# Patient Record
Sex: Female | Born: 1935
Health system: Southern US, Community
[De-identification: ages and names within clinical notes are randomized; demographics above are authoritative.]

## PROBLEM LIST (undated history)

## (undated) DIAGNOSIS — M5136 Other intervertebral disc degeneration, lumbar region: Secondary | ICD-10-CM

## (undated) DIAGNOSIS — C50919 Malignant neoplasm of unspecified site of unspecified female breast: Secondary | ICD-10-CM

## (undated) DIAGNOSIS — N179 Acute kidney failure, unspecified: Secondary | ICD-10-CM

## (undated) DIAGNOSIS — F0390 Unspecified dementia without behavioral disturbance: Secondary | ICD-10-CM

## (undated) DIAGNOSIS — H3530 Unspecified macular degeneration: Secondary | ICD-10-CM

## (undated) DIAGNOSIS — S065XAA Traumatic subdural hemorrhage with loss of consciousness status unknown, initial encounter: Secondary | ICD-10-CM

## (undated) DIAGNOSIS — S32000A Wedge compression fracture of unspecified lumbar vertebra, initial encounter for closed fracture: Secondary | ICD-10-CM

## (undated) DIAGNOSIS — S065X9A Traumatic subdural hemorrhage with loss of consciousness of unspecified duration, initial encounter: Secondary | ICD-10-CM

## (undated) DIAGNOSIS — S37009A Unspecified injury of unspecified kidney, initial encounter: Secondary | ICD-10-CM

## (undated) DIAGNOSIS — E871 Hypo-osmolality and hyponatremia: Secondary | ICD-10-CM

## (undated) DIAGNOSIS — R918 Other nonspecific abnormal finding of lung field: Secondary | ICD-10-CM

## (undated) DIAGNOSIS — F411 Generalized anxiety disorder: Secondary | ICD-10-CM

## (undated) DIAGNOSIS — A419 Sepsis, unspecified organism: Secondary | ICD-10-CM

## (undated) DIAGNOSIS — M51369 Other intervertebral disc degeneration, lumbar region without mention of lumbar back pain or lower extremity pain: Secondary | ICD-10-CM

## (undated) DIAGNOSIS — I872 Venous insufficiency (chronic) (peripheral): Secondary | ICD-10-CM

## (undated) DIAGNOSIS — K219 Gastro-esophageal reflux disease without esophagitis: Secondary | ICD-10-CM

## (undated) DIAGNOSIS — I10 Essential (primary) hypertension: Secondary | ICD-10-CM

---

## 2000-08-03 ENCOUNTER — Ambulatory Visit (HOSPITAL_COMMUNITY): Admission: RE | Admit: 2000-08-03 | Discharge: 2000-08-03 | Payer: Self-pay | Admitting: *Deleted

## 2000-08-03 ENCOUNTER — Encounter: Payer: Self-pay | Admitting: *Deleted

## 2011-11-03 ENCOUNTER — Ambulatory Visit (HOSPITAL_COMMUNITY)
Admission: RE | Admit: 2011-11-03 | Discharge: 2011-11-03 | Disposition: A | Discharge status: Home / Self Care | Payer: Medicare Other | Source: Ambulatory Visit | Attending: Family Medicine | Admitting: Family Medicine

## 2011-11-03 DIAGNOSIS — IMO0001 Reserved for inherently not codable concepts without codable children: Secondary | ICD-10-CM | POA: Insufficient documentation

## 2011-11-03 DIAGNOSIS — M79609 Pain in unspecified limb: Secondary | ICD-10-CM | POA: Insufficient documentation

## 2011-11-03 DIAGNOSIS — M7989 Other specified soft tissue disorders: Secondary | ICD-10-CM | POA: Insufficient documentation

## 2011-11-03 NOTE — Evaluation (Signed)
Physical Therapy Evaluation  Patient Details  Name: [name] MRN: [id number] Date of Birth: 10-29-1922  Today's Date: 11/03/2011 Time: 1530-1600 PT Time Calculation (min): 30 min  Visit#: 1  of 1   Assessment Diagnosis: leg pain  Authorization: medicare  Authorization Time Period:    Authorization Visit#: 1  of 1    Past Medical History: No past medical history on file. Past Surgical History: No past surgical history on file.  Subjective Symptoms/Limitations Symptoms: [name] is a resident in a nursing home.  She states that her legs have been hurting her for a very long time.  She comes to the department with Unnaboots on both LE wrapped in coban .  She has been referred for wound care. How long can you sit comfortably?: The patient is wheelchair bound.  She needs mod-max assist for transsfers How long can you stand comfortably?: N/A How long can you walk comfortably?: N/A Pain Assessment Currently in Pain?: Yes Pain Score:   6 Pain Location: Leg Pain Orientation: Right;Left;Lower Pain Type: Chronic pain Pain Onset: More than a month ago Pain Frequency: Constant Pain Relieving Factors: elevation; compression    Prior Functioning  Home Living Lives With:  (Pt lives in a nursing facility.)   Assessment  Pt was referred for wound care.  There were no wounds on either the right or the left LE.  Pt would benefit from compression hose.    Physical Therapy Assessment and Plan PT Assessment and Plan Clinical Impression Statement: Pt no longer has a wound.  The patient does have increased swelling and complains of her legs hurting.  Therapist placed TED hose on pt.  Pt may benefit from low compression hose,(15-20).  Therapist would recommend higher compression but placing TED hose onto patient was painful.   PT Plan: discharge pt no skilled therapy is needed    Goals  none   General Behavior During Session: [facility] for tasks performed Cognition: [facility] for tasks  performed  GP Functional Assessment Tool Used: observation of wound.  Wound is currently healed Functional Limitation: Other PT primary Other PT Primary Current Status (Y7829): 0 percent impaired, limited or restricted Other PT Primary Goal Status (F6213): 0 percent impaired, limited or restricted Other PT Primary Discharge Status [phone]): 0 percent impaired, limited or restricted  Hosam Mcfetridge,CINDY 11/03/2011, 4:24 PM  Physician Documentation Your signature is required to indicate approval of the treatment plan as stated above.  Please sign and either send electronically or make a copy of this report for your files and return this physician signed original.   Please mark one 1.__approve of plan  2. ___approve of plan with the following conditions.   ______________________________                                                          _____________________ Physician Signature  Date  

## 2012-01-18 ENCOUNTER — Telehealth (HOSPITAL_COMMUNITY): Payer: Self-pay | Admitting: Physical Therapy

## 2012-06-05 ENCOUNTER — Other Ambulatory Visit (HOSPITAL_COMMUNITY): Payer: Self-pay | Admitting: Internal Medicine

## 2012-06-05 DIAGNOSIS — J189 Pneumonia, unspecified organism: Secondary | ICD-10-CM

## 2012-06-06 ENCOUNTER — Ambulatory Visit (HOSPITAL_COMMUNITY)
Admission: RE | Admit: 2012-06-06 | Discharge: 2012-06-06 | Disposition: A | Discharge status: Home / Self Care | Payer: PRIVATE HEALTH INSURANCE | Source: Ambulatory Visit | Attending: Internal Medicine | Admitting: Internal Medicine

## 2012-06-06 DIAGNOSIS — K8020 Calculus of gallbladder without cholecystitis without obstruction: Secondary | ICD-10-CM | POA: Insufficient documentation

## 2012-06-06 DIAGNOSIS — J189 Pneumonia, unspecified organism: Secondary | ICD-10-CM | POA: Insufficient documentation

## 2012-06-06 DIAGNOSIS — R222 Localized swelling, mass and lump, trunk: Secondary | ICD-10-CM | POA: Insufficient documentation

## 2012-06-16 ENCOUNTER — Other Ambulatory Visit (HOSPITAL_COMMUNITY): Payer: Self-pay | Admitting: Internal Medicine

## 2012-06-16 DIAGNOSIS — R918 Other nonspecific abnormal finding of lung field: Secondary | ICD-10-CM

## 2012-06-20 ENCOUNTER — Ambulatory Visit (HOSPITAL_COMMUNITY)
Admission: RE | Admit: 2012-06-20 | Discharge: 2012-06-20 | Disposition: A | Discharge status: Home / Self Care | Payer: PRIVATE HEALTH INSURANCE | Source: Ambulatory Visit | Attending: Internal Medicine | Admitting: Internal Medicine

## 2012-06-20 ENCOUNTER — Encounter (HOSPITAL_COMMUNITY): Payer: Self-pay

## 2012-06-20 DIAGNOSIS — K5730 Diverticulosis of large intestine without perforation or abscess without bleeding: Secondary | ICD-10-CM | POA: Insufficient documentation

## 2012-06-20 DIAGNOSIS — I700 Atherosclerosis of aorta: Secondary | ICD-10-CM | POA: Insufficient documentation

## 2012-06-20 DIAGNOSIS — K439 Ventral hernia without obstruction or gangrene: Secondary | ICD-10-CM | POA: Insufficient documentation

## 2012-06-20 DIAGNOSIS — K449 Diaphragmatic hernia without obstruction or gangrene: Secondary | ICD-10-CM | POA: Insufficient documentation

## 2012-06-20 DIAGNOSIS — Z9010 Acquired absence of unspecified breast and nipple: Secondary | ICD-10-CM | POA: Insufficient documentation

## 2012-06-20 DIAGNOSIS — R911 Solitary pulmonary nodule: Secondary | ICD-10-CM | POA: Insufficient documentation

## 2012-06-20 DIAGNOSIS — I10 Essential (primary) hypertension: Secondary | ICD-10-CM | POA: Insufficient documentation

## 2012-06-20 DIAGNOSIS — I2510 Atherosclerotic heart disease of native coronary artery without angina pectoris: Secondary | ICD-10-CM | POA: Insufficient documentation

## 2012-06-20 DIAGNOSIS — K8020 Calculus of gallbladder without cholecystitis without obstruction: Secondary | ICD-10-CM | POA: Insufficient documentation

## 2012-06-20 DIAGNOSIS — R918 Other nonspecific abnormal finding of lung field: Secondary | ICD-10-CM

## 2012-06-20 DIAGNOSIS — F0390 Unspecified dementia without behavioral disturbance: Secondary | ICD-10-CM | POA: Insufficient documentation

## 2012-06-20 DIAGNOSIS — Z90710 Acquired absence of both cervix and uterus: Secondary | ICD-10-CM | POA: Insufficient documentation

## 2012-06-20 HISTORY — DX: Generalized anxiety disorder: F41.1

## 2012-06-20 HISTORY — DX: Unspecified dementia, unspecified severity, without behavioral disturbance, psychotic disturbance, mood disturbance, and anxiety: F03.90

## 2012-06-20 HISTORY — DX: Malignant neoplasm of unspecified site of unspecified female breast: C50.919

## 2012-06-20 HISTORY — DX: Sepsis, unspecified organism: A41.9

## 2012-06-20 HISTORY — DX: Other nonspecific abnormal finding of lung field: R91.8

## 2012-06-20 HISTORY — DX: Unspecified injury of unspecified kidney, initial encounter: S37.009A

## 2012-06-20 HISTORY — DX: Venous insufficiency (chronic) (peripheral): I87.2

## 2012-06-20 HISTORY — DX: Gastro-esophageal reflux disease without esophagitis: K21.9

## 2012-06-20 HISTORY — DX: Acute kidney failure, unspecified: N17.9

## 2012-06-20 HISTORY — DX: Traumatic subdural hemorrhage with loss of consciousness of unspecified duration, initial encounter: S06.5X9A

## 2012-06-20 HISTORY — DX: Essential (primary) hypertension: I10

## 2012-06-20 HISTORY — DX: Traumatic subdural hemorrhage with loss of consciousness status unknown, initial encounter: S06.5XAA

## 2012-06-20 HISTORY — DX: Hypo-osmolality and hyponatremia: E87.1

## 2012-06-20 HISTORY — DX: Unspecified macular degeneration: H35.30

## 2012-06-20 LAB — GLUCOSE, CAPILLARY: Glucose-Capillary: 87 mg/dL (ref 70–99)

## 2012-06-20 MED ORDER — FLUDEOXYGLUCOSE F - 18 (FDG) INJECTION
15.3000 | Freq: Once | INTRAVENOUS | Status: AC | PRN
  Administered 2012-06-20: 15.3 via INTRAVENOUS

## 2012-06-21 ENCOUNTER — Other Ambulatory Visit: Payer: Self-pay | Admitting: *Deleted

## 2012-06-21 ENCOUNTER — Encounter: Payer: Self-pay | Admitting: Cardiothoracic Surgery

## 2012-06-21 ENCOUNTER — Institutional Professional Consult (permissible substitution) (INDEPENDENT_AMBULATORY_CARE_PROVIDER_SITE_OTHER): Payer: Medicare Other | Admitting: Cardiothoracic Surgery

## 2012-06-21 VITALS — BP 128/62 | HR 63 | Resp 16 | Ht 62.0 in | Wt 172.0 lb

## 2012-06-21 DIAGNOSIS — Z7401 Bed confinement status: Secondary | ICD-10-CM

## 2012-06-21 DIAGNOSIS — R918 Other nonspecific abnormal finding of lung field: Secondary | ICD-10-CM

## 2012-06-21 DIAGNOSIS — R222 Localized swelling, mass and lump, trunk: Secondary | ICD-10-CM

## 2012-06-21 DIAGNOSIS — C50919 Malignant neoplasm of unspecified site of unspecified female breast: Secondary | ICD-10-CM

## 2012-06-21 NOTE — Progress Notes (Signed)
PCP is Avon Gully, MD Referring Provider is Avon Gully, MD  Chief Complaint  Patient presents with  . Lung Mass    CT CHEST/PET   patient examined and CT chest and PET scan is reviewed  HPI: 79 year old Caucasian female never smoker here for evaluation of a 7 mm nodule in the superior segment of the left lower lobe with negative PET activity and no abnormal adenopathy. Scans were performed in followup of the chest CT apparently done a [facility] last year when she was treated for urosepsis. It does not appear that the nodule is changed in size. Its morphology is consistent with either a scar or a possible slow-growing adenocarcinoma. The patient does have a clinical history of previous episodes of pneumonia. She has no current pulmonary symptoms. She is been a nursing home resident for approximately the past 2 years and is not ambulatory and spends most of her time in bed. Her weight has been stable. She is no pain other than her legs which have venous stasis changes, chronic edema, and she has had bilateral knee replacement surgery for arthritis  She has had 2 sons died of lung cancer but both were heavy smokers.  The patient is status post right radical mastectomy over 40 years ago for adenocarcinoma the breast and has had no recurrence over the years. The PET scan is negative.  The patient has extensive changes of atherosclerotic vascular disease on her scans. A 2-D echocardiogram performed recently shows EF of 60% with probable LVH and no significant valve disease. Past Medical History  Diagnosis Date  . Breast cancer   . Lung mass   . Venous insufficiency   . Senile macular degeneration   . Subdural hemorrhage following injury, without mention of open intracranial wound, unspecified state of consciousness   . Unspecified kidney injury without mention of open wound into cavity   . Anxiety state, unspecified   . Hyposmolality and/or hyponatremia   . Esophageal  reflux   . Hypertension   . Acute renal failure syndrome   . Sepsis   . Dementia   . Mass of lung 06/20/2012    Status post right radical mastectomy, status post right total knee replacement, status post left total knee replacement x3   Family history positive for carcinoma lung and 2 sons, carcinoma breast and a granddaughter, no history of MI CABG in the family no history of diabetes in the family  Social History History  Substance Use Topics  . Smoking status: Never Smoker   . Smokeless tobacco: Current User    Types: Snuff  . Alcohol Use: No    Current Outpatient Prescriptions  Medication Sig Dispense Refill  . acetaminophen (TYLENOL) 500 MG tablet Take 500 mg by mouth every 6 (six) hours as needed for pain.      . cholecalciferol (VITAMIN D) 1000 UNITS tablet Take 1,000 Units by mouth daily.      . clonazePAM (KLONOPIN) 0.25 MG disintegrating tablet Take 0.25 mg by mouth daily.      Marland Kitchen docusate sodium (COLACE) 100 MG capsule Take 100 mg by mouth 2 (two) times daily.      . enalapril (VASOTEC) 20 MG tablet Take 20 mg by mouth 2 (two) times daily.      . famotidine (PEPCID) 10 MG tablet Take 10 mg by mouth 2 (two) times daily.      . furosemide (LASIX) 40 MG tablet Take 40 mg by mouth daily.      Marland Kitchen ipratropium-albuterol (  DUONEB) 0.5-2.5 (3) MG/3ML SOLN Take 3 mLs by nebulization every 6 (six) hours as needed.      . Multiple Vitamin (MULTIVITAMIN) tablet Take 1 tablet by mouth daily.      Marland Kitchen olopatadine (PATANOL) 0.1 % ophthalmic solution Place 1 drop into both eyes 2 (two) times daily.       No current facility-administered medications for this visit.    No Known Allergies  Review of Systems Gen. No weight loss living in a nursing home for approximately 2 years nonambulatory HEENT loose right front lower tooth no upper teeth present Chest status post right radical mastectomy history of pneumonia Cardiac no angina positive for diastolic CHF GI gallstones noted on her CT  scan asymptomatic Neurologic hypertension no clinical strokes Endocrine no diabetes  BP 128/62  Pulse 63  Resp 16  Ht 5\' 2"  (1.575 m)  Wt 172 lb (78.019 kg)  BMI 31.45 kg/m2  SpO2 97% Physical Exam Gen. elderly obese female in a wheelchair accompanied by family no distress HEENT normocephalic pupils equal dentition poor Neck without JVD mass or bruit Lymphatics no palpable lymph nodes in the neck or supraclavicular fossa Thorax postsurgical changes right radical mastectomy breath sounds clear and equal Cardiac regular rhythm without murmur or gallop Abdomen obese nontender Extremities 3+ pedal edema bilaterally Neurologic generally weak in wheelchair   Diagnostic Tests: CT scan shows a 7 mm peripheral nodules. Segment left lower lobe with negative activity and PET scan. This is consistent with either scar or possible slow-growing adenocarcinoma. Doubt this is a metastatic focus from her remote breast cancer  Impression: Possible slow-growing adenocarcinoma the left lower lobe This is too small to biopsy and patient is not a surgical candidate  Plan: Follow up CT scan of chest in 6 months. If the nodule show signs of growth in biopsy and stereotactic radiation therapy would be her best therapeutic option if indicated

## 2012-11-17 ENCOUNTER — Other Ambulatory Visit: Payer: Self-pay | Admitting: *Deleted

## 2012-11-17 DIAGNOSIS — R222 Localized swelling, mass and lump, trunk: Secondary | ICD-10-CM

## 2012-12-13 ENCOUNTER — Ambulatory Visit (INDEPENDENT_AMBULATORY_CARE_PROVIDER_SITE_OTHER): Payer: PRIVATE HEALTH INSURANCE | Admitting: Cardiothoracic Surgery

## 2012-12-13 ENCOUNTER — Encounter: Payer: Self-pay | Admitting: Cardiothoracic Surgery

## 2012-12-13 ENCOUNTER — Ambulatory Visit
Admission: RE | Admit: 2012-12-13 | Discharge: 2012-12-13 | Disposition: A | Discharge status: Home / Self Care | Payer: PRIVATE HEALTH INSURANCE | Source: Ambulatory Visit | Attending: Cardiothoracic Surgery | Admitting: Cardiothoracic Surgery

## 2012-12-13 VITALS — BP 126/52 | HR 63 | Resp 20 | Ht 62.0 in

## 2012-12-13 DIAGNOSIS — R911 Solitary pulmonary nodule: Secondary | ICD-10-CM

## 2012-12-13 DIAGNOSIS — R918 Other nonspecific abnormal finding of lung field: Secondary | ICD-10-CM

## 2012-12-13 MED ORDER — IOHEXOL 300 MG/ML  SOLN: 75.0000 mL | Freq: Once | INTRAMUSCULAR | Status: AC | PRN

## 2012-12-13 NOTE — Progress Notes (Signed)
PCP is Avon Gully, MD Referring Provider is Avon Gully, MD  Chief Complaint  Patient presents with  . Lung Mass    6 month f/u with Chest CT    HPI: Patient returns for 6 month followup CT scan of a small nodule in the superior segment left lower lobe. This was evaluated previously with a PET CT scan which showed no hypermetabolic activity. Is felt to be a sub--solid nodule measuring approximately 6-8 mm. The patient is a never smoker.  The patient is wheelchair-bound and lives in a nursing home in [place]. No recent pulmonary illnesses reported. The patient has dementia and is not a candidate for any type of thoracic surgery or general anesthesia.  CT scan of the chest today compared to the scan in April shows probable resolution of the nodule although the official radiology report is pending.  Past Medical History  Diagnosis Date  . Breast cancer   . Lung mass   . Venous insufficiency   . Senile macular degeneration   . Subdural hemorrhage following injury, without mention of open intracranial wound, unspecified state of consciousness   . Unspecified kidney injury without mention of open wound into cavity   . Anxiety state, unspecified   . Hyposmolality and/or hyponatremia   . Esophageal reflux   . Hypertension   . Acute renal failure syndrome   . Sepsis   . Dementia   . Mass of lung 06/20/2012    No past surgical history on file.  No family history on file.  Social History History  Substance Use Topics  . Smoking status: Never Smoker   . Smokeless tobacco: Current User    Types: Snuff  . Alcohol Use: No    Current Outpatient Prescriptions  Medication Sig Dispense Refill  . acetaminophen (TYLENOL) 500 MG tablet Take 500 mg by mouth every 6 (six) hours as needed for pain.      Marland Kitchen amLODipine (NORVASC) 10 MG tablet Take 10 mg by mouth daily.      . beta carotene w/minerals (OCUVITE) tablet Take 1 tablet by mouth daily.      . cholecalciferol (VITAMIN D) 1000  UNITS tablet Take 1,000 Units by mouth daily.      . clonazePAM (KLONOPIN) 0.25 MG disintegrating tablet Take 0.25 mg by mouth daily.      Marland Kitchen docusate sodium (COLACE) 100 MG capsule Take 100 mg by mouth 2 (two) times daily.      . enalapril (VASOTEC) 20 MG tablet Take 20 mg by mouth 2 (two) times daily.      . famotidine (PEPCID) 10 MG tablet Take 10 mg by mouth 2 (two) times daily.      . furosemide (LASIX) 40 MG tablet Take 40 mg by mouth 2 (two) times daily.       Marland Kitchen ipratropium-albuterol (DUONEB) 0.5-2.5 (3) MG/3ML SOLN Take 3 mLs by nebulization every 6 (six) hours as needed.      . iron polysaccharides (NIFEREX) 150 MG capsule Take 150 mg by mouth daily.      . Multiple Vitamin (MULTIVITAMIN) tablet Take 1 tablet by mouth daily.      Marland Kitchen olopatadine (PATANOL) 0.1 % ophthalmic solution Place 1 drop into both eyes 2 (two) times daily.       No current facility-administered medications for this visit.   Facility-Administered Medications Ordered in Other Visits  Medication Dose Route Frequency Provider Last Rate Last Dose  . iohexol (OMNIPAQUE) 300 MG/ML solution 75 mL  75 mL Intravenous Once  PRN Medication Radiologist, MD        No Known Allergies  Review of Systems patient states she received a flu vaccination earlier this month at the nursing home.  BP 126/52  Pulse 63  Resp 20  Ht 5\' 2"  (1.575 m)  SpO2 91% Physical Exam Chronically ill minimally responsive 79 year old female No palpable nodes in the neck Diminished breath sounds  Diagnostic Tests:  CT scan performed today is reviewed and discussed with patient and family. Previously noted superior segment left lower lobe nodule appears to be resolved   Impression: Resolution of nodule-patient not a surgical candidate for any type of the thoracic surgical procedure.  Plan: Further CT scan  is not necessary No followup needed

## 2013-02-13 ENCOUNTER — Inpatient Hospital Stay (HOSPITAL_COMMUNITY)
Admission: EM | Admit: 2013-02-13 | Discharge: 2013-02-19 | DRG: 641 | Disposition: A | Discharge status: Skilled Nursing Facility | Payer: PRIVATE HEALTH INSURANCE | Attending: Internal Medicine | Admitting: Internal Medicine

## 2013-02-13 ENCOUNTER — Emergency Department (HOSPITAL_COMMUNITY): Payer: PRIVATE HEALTH INSURANCE

## 2013-02-13 ENCOUNTER — Inpatient Hospital Stay (HOSPITAL_COMMUNITY): Payer: PRIVATE HEALTH INSURANCE

## 2013-02-13 ENCOUNTER — Encounter (HOSPITAL_COMMUNITY): Payer: Self-pay | Admitting: Emergency Medicine

## 2013-02-13 DIAGNOSIS — F17200 Nicotine dependence, unspecified, uncomplicated: Secondary | ICD-10-CM | POA: Diagnosis present

## 2013-02-13 DIAGNOSIS — S32009A Unspecified fracture of unspecified lumbar vertebra, initial encounter for closed fracture: Secondary | ICD-10-CM | POA: Diagnosis present

## 2013-02-13 DIAGNOSIS — Y9210 Unspecified residential institution as the place of occurrence of the external cause: Secondary | ICD-10-CM | POA: Diagnosis present

## 2013-02-13 DIAGNOSIS — W19XXXA Unspecified fall, initial encounter: Secondary | ICD-10-CM | POA: Diagnosis present

## 2013-02-13 DIAGNOSIS — H3530 Unspecified macular degeneration: Secondary | ICD-10-CM | POA: Diagnosis present

## 2013-02-13 DIAGNOSIS — Z853 Personal history of malignant neoplasm of breast: Secondary | ICD-10-CM

## 2013-02-13 DIAGNOSIS — E871 Hypo-osmolality and hyponatremia: Principal | ICD-10-CM | POA: Diagnosis present

## 2013-02-13 DIAGNOSIS — N390 Urinary tract infection, site not specified: Secondary | ICD-10-CM

## 2013-02-13 DIAGNOSIS — F0390 Unspecified dementia without behavioral disturbance: Secondary | ICD-10-CM | POA: Diagnosis present

## 2013-02-13 DIAGNOSIS — I10 Essential (primary) hypertension: Secondary | ICD-10-CM | POA: Diagnosis present

## 2013-02-13 DIAGNOSIS — K219 Gastro-esophageal reflux disease without esophagitis: Secondary | ICD-10-CM | POA: Diagnosis present

## 2013-02-13 DIAGNOSIS — I872 Venous insufficiency (chronic) (peripheral): Secondary | ICD-10-CM | POA: Diagnosis present

## 2013-02-13 DIAGNOSIS — A498 Other bacterial infections of unspecified site: Secondary | ICD-10-CM | POA: Diagnosis present

## 2013-02-13 DIAGNOSIS — F411 Generalized anxiety disorder: Secondary | ICD-10-CM | POA: Diagnosis present

## 2013-02-13 LAB — CBC WITH DIFFERENTIAL/PLATELET
Eosinophils Relative: 0 % (ref 0–5)
Hemoglobin: 10.4 g/dL — ABNORMAL LOW (ref 12.0–15.0)
Lymphocytes Relative: 9 % — ABNORMAL LOW (ref 12–46)
Lymphs Abs: 0.7 10*3/uL (ref 0.7–4.0)
MCV: 89 fL (ref 78.0–100.0)
Monocytes Relative: 8 % (ref 3–12)
Neutrophils Relative %: 83 % — ABNORMAL HIGH (ref 43–77)
Platelets: 219 10*3/uL (ref 150–400)
RBC: 3.28 MIL/uL — ABNORMAL LOW (ref 3.87–5.11)
RDW: 12.6 % (ref 11.5–15.5)
WBC: 7.6 10*3/uL (ref 4.0–10.5)

## 2013-02-13 LAB — URINALYSIS, ROUTINE W REFLEX MICROSCOPIC
Ketones, ur: NEGATIVE mg/dL
Nitrite: NEGATIVE
Protein, ur: NEGATIVE mg/dL
Specific Gravity, Urine: 1.01 (ref 1.005–1.030)
Urobilinogen, UA: 0.2 mg/dL (ref 0.0–1.0)

## 2013-02-13 LAB — COMPREHENSIVE METABOLIC PANEL
ALT: 10 U/L (ref 0–35)
Albumin: 3.4 g/dL — ABNORMAL LOW (ref 3.5–5.2)
Alkaline Phosphatase: 23 U/L — ABNORMAL LOW (ref 39–117)
BUN: 30 mg/dL — ABNORMAL HIGH (ref 6–23)
CO2: 24 mEq/L (ref 19–32)
Chloride: 85 mEq/L — ABNORMAL LOW (ref 96–112)
Creatinine, Ser: 1.16 mg/dL — ABNORMAL HIGH (ref 0.50–1.10)
GFR calc Af Amer: 47 mL/min — ABNORMAL LOW (ref >90)
GFR calc non Af Amer: 41 mL/min — ABNORMAL LOW (ref >90)
Glucose, Bld: 141 mg/dL — ABNORMAL HIGH (ref 70–99)
Potassium: 4.7 mEq/L (ref 3.5–5.1)
Sodium: 121 mEq/L — ABNORMAL LOW (ref 135–145)
Total Bilirubin: 0.5 mg/dL (ref 0.3–1.2)

## 2013-02-13 LAB — URINE MICROSCOPIC-ADD ON

## 2013-02-13 LAB — CBC
HCT: 29.9 % — ABNORMAL LOW (ref 36.0–46.0)
MCH: 32.5 pg (ref 26.0–34.0)
Platelets: 210 10*3/uL (ref 150–400)
RDW: 12.6 % (ref 11.5–15.5)
WBC: 9.2 10*3/uL (ref 4.0–10.5)

## 2013-02-13 LAB — BASIC METABOLIC PANEL
BUN: 28 mg/dL — ABNORMAL HIGH (ref 6–23)
CO2: 23 mEq/L (ref 19–32)
Chloride: 85 mEq/L — ABNORMAL LOW (ref 96–112)
Creatinine, Ser: 1.04 mg/dL (ref 0.50–1.10)
Potassium: 4.5 mEq/L (ref 3.5–5.1)

## 2013-02-13 LAB — TROPONIN I: Troponin I: <0.3 ng/mL (ref <0.30)

## 2013-02-13 MED ORDER — ENOXAPARIN SODIUM 40 MG/0.4ML ~~LOC~~ SOLN
40.0000 mg | SUBCUTANEOUS | Status: DC
  Administered 2013-02-13 – 2013-02-18 (×6): 40 mg via SUBCUTANEOUS
  Filled 2013-02-13 (×6): qty 0.4

## 2013-02-13 MED ORDER — DEXTROSE 5 % IV SOLN
1.0000 g | INTRAVENOUS | Status: DC
  Administered 2013-02-13 – 2013-02-18 (×6): 1 g via INTRAVENOUS
  Filled 2013-02-13 (×6): qty 10

## 2013-02-13 MED ORDER — SODIUM CHLORIDE 0.9 % IV SOLN: 250.0000 mL | INTRAVENOUS | Status: DC | PRN

## 2013-02-13 MED ORDER — ENALAPRIL MALEATE 5 MG PO TABS
40.0000 mg | ORAL_TABLET | Freq: Every day | ORAL | Status: DC
  Administered 2013-02-14 – 2013-02-19 (×6): 40 mg via ORAL
  Filled 2013-02-13 (×6): qty 8

## 2013-02-13 MED ORDER — FENTANYL 12 MCG/HR TD PT72
12.5000 ug | MEDICATED_PATCH | TRANSDERMAL | Status: DC
  Administered 2013-02-13 – 2013-02-16 (×2): 12.5 ug via TRANSDERMAL
  Filled 2013-02-13 (×2): qty 1

## 2013-02-13 MED ORDER — AMLODIPINE BESYLATE 5 MG PO TABS
10.0000 mg | ORAL_TABLET | Freq: Every day | ORAL | Status: DC
  Administered 2013-02-14 – 2013-02-19 (×6): 10 mg via ORAL
  Filled 2013-02-13 (×6): qty 2

## 2013-02-13 MED ORDER — CLONAZEPAM 0.25 MG PO TBDP: 0.2500 mg | ORAL_TABLET | Freq: Every day | ORAL | Status: DC

## 2013-02-13 MED ORDER — SODIUM CHLORIDE 0.9 % IV SOLN
INTRAVENOUS | Status: DC
  Administered 2013-02-13: 18:00:00 via INTRAVENOUS

## 2013-02-13 MED ORDER — DOCUSATE SODIUM 100 MG PO CAPS
100.0000 mg | ORAL_CAPSULE | Freq: Two times a day (BID) | ORAL | Status: DC
  Administered 2013-02-13 – 2013-02-19 (×12): 100 mg via ORAL
  Filled 2013-02-13 (×12): qty 1

## 2013-02-13 MED ORDER — HYDROCODONE-ACETAMINOPHEN 5-325 MG PO TABS
1.0000 | ORAL_TABLET | ORAL | Status: DC | PRN
  Administered 2013-02-13: 1 via ORAL
  Administered 2013-02-14 (×3): 2 via ORAL
  Administered 2013-02-14: 1 via ORAL
  Administered 2013-02-15: 2 via ORAL
  Administered 2013-02-15 – 2013-02-16 (×2): 1 via ORAL
  Administered 2013-02-16 – 2013-02-19 (×5): 2 via ORAL
  Filled 2013-02-13: qty 1
  Filled 2013-02-13: qty 2
  Filled 2013-02-13 (×2): qty 1
  Filled 2013-02-13 (×3): qty 2
  Filled 2013-02-13: qty 1
  Filled 2013-02-13 (×5): qty 2
  Filled 2013-02-13: qty 1

## 2013-02-13 MED ORDER — OCUVITE PO TABS
1.0000 | ORAL_TABLET | Freq: Every day | ORAL | Status: DC
  Administered 2013-02-15 – 2013-02-19 (×5): 1 via ORAL
  Filled 2013-02-13 (×6): qty 1

## 2013-02-13 MED ORDER — ACETAMINOPHEN 500 MG PO TABS: 500.0000 mg | ORAL_TABLET | Freq: Four times a day (QID) | ORAL | Status: DC | PRN

## 2013-02-13 MED ORDER — SODIUM CHLORIDE 0.9 % IJ SOLN: 3.0000 mL | INTRAMUSCULAR | Status: DC | PRN

## 2013-02-13 MED ORDER — SODIUM CHLORIDE 0.9 % IV SOLN
INTRAVENOUS | Status: DC
  Administered 2013-02-14 – 2013-02-18 (×4): via INTRAVENOUS

## 2013-02-13 MED ORDER — SODIUM CHLORIDE 0.9 % IJ SOLN
3.0000 mL | Freq: Two times a day (BID) | INTRAMUSCULAR | Status: DC
  Administered 2013-02-14 – 2013-02-16 (×4): 3 mL via INTRAVENOUS

## 2013-02-13 MED ORDER — DEXTROSE 5 % IV SOLN
INTRAVENOUS | Status: AC
  Filled 2013-02-13: qty 10

## 2013-02-13 NOTE — ED Notes (Signed)
Pt c/o left shoulder pain, left rib pain, and bilateral foot pain. Pt states she fell while trying to get out of bed. Pt denies hitting head/loc. Pt family states they received a call that pt fell at 0600 this am. Sending nurse from [place] states she came on her shift at 1500 today and pt was complaining of left side pain.

## 2013-02-13 NOTE — ED Provider Notes (Addendum)
CSN: [id number]     Arrival date & time 02/13/13  1628 History   First MD Initiated Contact with Patient 02/13/13 1647     Chief Complaint  Patient presents with  . Fall   (Consider location/radiation/quality/duration/timing/severity/associated sxs/prior Treatment) Patient is a 79 y.o. female presenting with fall. The history is provided by the patient. The history is limited by the condition of the patient.  Fall   patient here complaining of diffuse body pain worse in her chest wall and lower spine after in an unwitnessed fall yesterday. She had no loss of consciousness. See is unsure what caused the incident. She denied any chest pain or chest pressure. She is a poor historian. Continue to note increased pain today characterized as sharp and worse with movement more so in her entire spine. Patient presents for further evaluation. No treatment used prior to arrival. Denies any vomiting or fever. No recent black or bloody stools.  Past Medical History  Diagnosis Date  . Breast cancer   . Lung mass   . Venous insufficiency   . Senile macular degeneration   . Subdural hemorrhage following injury, without mention of open intracranial wound, unspecified state of consciousness   . Unspecified kidney injury without mention of open wound into cavity   . Anxiety state, unspecified   . Hyposmolality and/or hyponatremia   . Esophageal reflux   . Hypertension   . Acute renal failure syndrome   . Sepsis   . Dementia   . Mass of lung 06/20/2012   History reviewed. No pertinent past surgical history. No family history on file. History  Substance Use Topics  . Smoking status: Never Smoker   . Smokeless tobacco: Current User    Types: Snuff  . Alcohol Use: No   OB History   Grav Para Term Preterm Abortions TAB SAB Ect Mult Living                 Review of Systems  All other systems reviewed and are negative.    Allergies  Review of patient's allergies indicates no known  allergies.  Home Medications   Current Outpatient Rx  Name  Route  Sig  Dispense  Refill  . acetaminophen (TYLENOL) 500 MG tablet   Oral   Take 500 mg by mouth every 6 (six) hours as needed for pain.         Marland Kitchen amLODipine (NORVASC) 10 MG tablet   Oral   Take 10 mg by mouth daily.         . beta carotene w/minerals (OCUVITE) tablet   Oral   Take 1 tablet by mouth daily.         . cholecalciferol (VITAMIN D) 1000 UNITS tablet   Oral   Take 1,000 Units by mouth daily.         . clonazePAM (KLONOPIN) 0.25 MG disintegrating tablet   Oral   Take 0.25 mg by mouth daily.         Marland Kitchen docusate sodium (COLACE) 100 MG capsule   Oral   Take 100 mg by mouth 2 (two) times daily.         . enalapril (VASOTEC) 20 MG tablet   Oral   Take 20 mg by mouth 2 (two) times daily.         . famotidine (PEPCID) 10 MG tablet   Oral   Take 10 mg by mouth 2 (two) times daily.         . furosemide (  LASIX) 40 MG tablet   Oral   Take 40 mg by mouth 2 (two) times daily.          Marland Kitchen ipratropium-albuterol (DUONEB) 0.5-2.5 (3) MG/3ML SOLN   Nebulization   Take 3 mLs by nebulization every 6 (six) hours as needed.         . iron polysaccharides (NIFEREX) 150 MG capsule   Oral   Take 150 mg by mouth daily.         . Multiple Vitamin (MULTIVITAMIN) tablet   Oral   Take 1 tablet by mouth daily.         Marland Kitchen olopatadine (PATANOL) 0.1 % ophthalmic solution   Both Eyes   Place 1 drop into both eyes 2 (two) times daily.          BP 136/45  Pulse 70  Temp(Src) 97.9 F (36.6 C)  Resp 20  Ht 5\' 4"  (1.626 m)  Wt 180 lb (81.647 kg)  BMI 30.88 kg/m2  SpO2 95% Physical Exam  Nursing note and vitals reviewed. Constitutional: She appears well-developed and well-nourished.  Non-toxic appearance. No distress.  HENT:  Head: Normocephalic and atraumatic.  Eyes: Conjunctivae, EOM and lids are normal. Pupils are equal, round, and reactive to light.  Neck: Normal range of motion. Neck  supple. No tracheal deviation present. No mass present.  Cardiovascular: Normal rate, regular rhythm and normal heart sounds.  Exam reveals no gallop.   No murmur heard. Pulmonary/Chest: Effort normal and breath sounds normal. No stridor. No respiratory distress. She has no decreased breath sounds. She has no wheezes. She has no rhonchi. She has no rales. She exhibits no tenderness and no bony tenderness.  Abdominal: Soft. Normal appearance and bowel sounds are normal. She exhibits no distension. There is no tenderness. There is no rebound and no CVA tenderness.  Musculoskeletal: Normal range of motion. She exhibits no edema and no tenderness.       Back:  Full range of motion at her hips without shortening or deformity  Neurological: She is alert. She has normal strength. No cranial nerve deficit or sensory deficit. GCS eye subscore is 4. GCS verbal subscore is 5. GCS motor subscore is 6.  Skin: Skin is warm and dry. No abrasion and no rash noted.  Psychiatric: Her affect is blunt. Her speech is delayed. She is slowed.    ED Course  Procedures (including critical care time) Labs Review Labs Reviewed  URINE CULTURE  URINALYSIS, ROUTINE W REFLEX MICROSCOPIC  CBC WITH DIFFERENTIAL  COMPREHENSIVE METABOLIC PANEL  TROPONIN I   Imaging Review No results found.  EKG Interpretation    Date/Time:  Tuesday February 13 2013 17:09:26 EST Ventricular Rate:  66 PR Interval:  188 QRS Duration: 94 QT Interval:  394 QTC Calculation: 413 R Axis:   43 Text Interpretation:  Normal sinus rhythm Possible Anterior infarct , age undetermined Abnormal ECG No previous ECGs available Confirmed by CAMPOS  MD, KEVIN ([id number]) on 02/13/2013 6:30:15 PM            MDM  No diagnosis found. Patient hyponatremia with a sodium of 121. This is likely the reason why she fell. She also has question of a rib fracture as well as L2 spinal fracture. She has no gross neurological deficits. Will be admitted to the  hospitalist service    Toy Baker, MD 02/13/13 1831  Toy Baker, MD 02/13/13 [phone]

## 2013-02-13 NOTE — H&P (Signed)
PCP:   FANTA,TESFAYE, MD   Chief Complaint:  Status post fall  HPI:  79 year old female who  has a past medical history of Breast cancer; Lung mass; Venous insufficiency; Senile macular degeneration; Subdural hemorrhage following injury, without mention of open intracranial wound, unspecified state of consciousness; Unspecified kidney injury without mention of open wound into cavity; Anxiety state, unspecified; Hyposmolality and/or hyponatremia; Esophageal reflux; Hypertension; Acute renal failure syndrome; Sepsis; Dementia; and Mass of lung (06/20/2012). Today was sent to ED from nursing home after patient sustained a fall this morning. Patient complained of back pain and was sent for further evaluation. Patient also complained of chest pain and was found to have possible left rib fractures chest x-ray. As per daughter patient has underlying dementia, and usually gets confused when her sodium drops. Patient has been confused over the past few days as per daughter. She denies nausea vomiting or diarrhea. Patient was found to have low sodium in the ED along with abnormal UA. Lumbar spine x-ray shows acute L2 fracture. No history of fever or dysuria  Allergies:  No Known Allergies    Past Medical History  Diagnosis Date  . Breast cancer   . Lung mass   . Venous insufficiency   . Senile macular degeneration   . Subdural hemorrhage following injury, without mention of open intracranial wound, unspecified state of consciousness   . Unspecified kidney injury without mention of open wound into cavity   . Anxiety state, unspecified   . Hyposmolality and/or hyponatremia   . Esophageal reflux   . Hypertension   . Acute renal failure syndrome   . Sepsis   . Dementia   . Mass of lung 06/20/2012    History reviewed. No pertinent past surgical history.  Prior to Admission medications   Medication Sig Start Date End Date Taking? Authorizing Provider  acetaminophen (TYLENOL) 500 MG tablet Take 500  mg by mouth every 6 (six) hours as needed for pain.   Yes Historical Provider, MD  amLODipine (NORVASC) 10 MG tablet Take 10 mg by mouth daily.   Yes Historical Provider, MD  beta carotene w/minerals (OCUVITE) tablet Take 1 tablet by mouth daily.   Yes Historical Provider, MD  cholecalciferol (VITAMIN D) 1000 UNITS tablet Take 1,000 Units by mouth 2 (two) times daily.    Yes Historical Provider, MD  clonazePAM (KLONOPIN) 0.25 MG disintegrating tablet Take 0.25 mg by mouth daily.   Yes Historical Provider, MD  docusate sodium (COLACE) 100 MG capsule Take 100 mg by mouth 2 (two) times daily.   Yes Historical Provider, MD  enalapril (VASOTEC) 20 MG tablet Take 40 mg by mouth daily.    Yes Historical Provider, MD  furosemide (LASIX) 40 MG tablet Take 50 mg by mouth 2 (two) times daily.    Yes Historical Provider, MD  iron polysaccharides (POLY-IRON 150) 150 MG capsule Take 150 mg by mouth daily.   Yes Historical Provider, MD    Social History:  reports that she has never smoked. Her smokeless tobacco use includes Snuff. She reports that she does not drink alcohol. Her drug history is not on file.  No family history on file.   All the positives are listed in BOLD  Review of Systems:  HEENT: Headache, blurred vision, runny nose, sore throat Neck: Hypothyroidism, hyperthyroidism,,lymphadenopathy Chest : Shortness of breath, history of COPD, Asthma Heart : Chest pain, history of coronary arterey disease GI:  Nausea, vomiting, diarrhea, constipation, GERD GU: Dysuria, urgency, frequency of urination, hematuria Neuro:  Stroke, seizures, syncope Psych: Depression, anxiety, hallucinations   Physical Exam: Blood pressure 148/55, pulse 76, temperature 97.8 F (36.6 C), temperature source Oral, resp. rate 20, height 5\' 4"  (1.626 m), weight 72.9 kg (160 lb 11.5 oz), SpO2 95.00%. Constitutional:   Patient is a well-developed and well-nourished female* in no acute distress and cooperative with  exam. Head: Normocephalic and atraumatic Mouth: Mucus membranes moist Eyes: PERRL, EOMI, conjunctivae normal Neck: Supple, No Thyromegaly Cardiovascular: RRR, S1 normal, S2 normal Pulmonary/Chest: CTAB, no wheezes, rales, or rhonchi Abdominal: Soft. Non-tender, non-distended, bowel sounds are normal, no masses, organomegaly, or guarding present.  Neurological: A&O x3, Strenght is normal and symmetric bilaterally, cranial nerve II-XII are grossly intact, no focal motor deficit, sensory intact to light touch bilaterally.  Extremities : No Cyanosis, Clubbing or Edema   Labs on Admission:  Results for orders placed during the hospital encounter of 02/13/13 (from the past 48 hour(s))  CBC WITH DIFFERENTIAL     Status: Abnormal   Collection Time    02/13/13  5:07 PM      Result Value Range   WBC 7.6  4.0 - 10.5 K/uL   RBC 3.28 (*) 3.87 - 5.11 MIL/uL   Hemoglobin 10.4 (*) 12.0 - 15.0 g/dL   HCT 16.1 (*) 09.6 - 04.5 %   MCV 89.0  78.0 - 100.0 fL   MCH 31.7  26.0 - 34.0 pg   MCHC 35.6  30.0 - 36.0 g/dL   RDW 40.9  81.1 - 91.4 %   Platelets 219  150 - 400 K/uL   Neutrophils Relative % 83 (*) 43 - 77 %   Neutro Abs 6.4  1.7 - 7.7 K/uL   Lymphocytes Relative 9 (*) 12 - 46 %   Lymphs Abs 0.7  0.7 - 4.0 K/uL   Monocytes Relative 8  3 - 12 %   Monocytes Absolute 0.6  0.1 - 1.0 K/uL   Eosinophils Relative 0  0 - 5 %   Eosinophils Absolute 0.0  0.0 - 0.7 K/uL   Basophils Relative 0  0 - 1 %   Basophils Absolute 0.0  0.0 - 0.1 K/uL  COMPREHENSIVE METABOLIC PANEL     Status: Abnormal   Collection Time    02/13/13  5:07 PM      Result Value Range   Sodium 121 (*) 135 - 145 mEq/L   Potassium 4.7  3.5 - 5.1 mEq/L   Chloride 85 (*) 96 - 112 mEq/L   CO2 24  19 - 32 mEq/L   Glucose, Bld 141 (*) 70 - 99 mg/dL   BUN 30 (*) 6 - 23 mg/dL   Creatinine, Ser 7.82 (*) 0.50 - 1.10 mg/dL   Calcium 9.3  8.4 - 95.6 mg/dL   Total Protein 7.0  6.0 - 8.3 g/dL   Albumin 3.4 (*) 3.5 - 5.2 g/dL   AST 13  0 -  37 U/L   ALT 10  0 - 35 U/L   Alkaline Phosphatase 23 (*) 39 - 117 U/L   Total Bilirubin 0.5  0.3 - 1.2 mg/dL   GFR calc non Af Amer 41 (*) >90 mL/min   GFR calc Af Amer 47 (*) >90 mL/min   Comment: (NOTE)     The eGFR has been calculated using the CKD EPI equation.     This calculation has not been validated in all clinical situations.     eGFR's persistently <90 mL/min signify possible Chronic Kidney  Disease.  TROPONIN I     Status: None   Collection Time    02/13/13  5:07 PM      Result Value Range   Troponin I <0.30  <0.30 ng/mL   Comment:            Due to the release kinetics of cTnI,     a negative result within the first hours     of the onset of symptoms does not rule out     myocardial infarction with certainty.     If myocardial infarction is still suspected,     repeat the test at appropriate intervals.  URINALYSIS, ROUTINE W REFLEX MICROSCOPIC     Status: Abnormal   Collection Time    02/13/13  6:13 PM      Result Value Range   Color, Urine YELLOW  YELLOW   APPearance HAZY (*) CLEAR   Specific Gravity, Urine 1.010  1.005 - 1.030   pH 6.0  5.0 - 8.0   Glucose, UA NEGATIVE  NEGATIVE mg/dL   Hgb urine dipstick MODERATE (*) NEGATIVE   Bilirubin Urine NEGATIVE  NEGATIVE   Ketones, ur NEGATIVE  NEGATIVE mg/dL   Protein, ur NEGATIVE  NEGATIVE mg/dL   Urobilinogen, UA 0.2  0.0 - 1.0 mg/dL   Nitrite NEGATIVE  NEGATIVE   Leukocytes, UA LARGE (*) NEGATIVE  URINE MICROSCOPIC-ADD ON     Status: Abnormal   Collection Time    02/13/13  6:13 PM      Result Value Range   Squamous Epithelial / LPF RARE  RARE   WBC, UA TOO NUMEROUS TO COUNT  <3 WBC/hpf   RBC / HPF 21-50  <3 RBC/hpf   Bacteria, UA MANY (*) RARE    Radiological Exams on Admission: Dg Chest 1 View  02/13/2013   CLINICAL DATA:  Fall today  EXAM: CHEST - 1 VIEW  COMPARISON:  CT chest 12/13/2012  FINDINGS: Left lower lobe airspace consolidation was not present previously. This has a somewhat linear  configuration and may be atelectasis however pneumonia not excluded. Left rib fractures not excluded but not definitely diagnosed . Left rib series may be helpful if there is pain in this area.  Right lung is clear.  Negative for heart failure.  IMPRESSION: Question left rib fractures. Left lower lobe atelectasis is present.   Electronically Signed   By: Marlan Palau M.D.   On: 02/13/2013 17:50   Dg Thoracic Spine 2 View  02/13/2013   CLINICAL DATA:  Fall  EXAM: THORACIC SPINE - 2 VIEW  COMPARISON:  None.  FINDINGS: Negative for thoracic fracture. No mass lesion. Mild degenerative changes in the thoracic spine with mild scoliosis.  IMPRESSION: Negative.   Electronically Signed   By: Marlan Palau M.D.   On: 02/13/2013 17:51   Dg Lumbar Spine Complete  02/13/2013   CLINICAL DATA:  Fall.  EXAM: LUMBAR SPINE - COMPLETE 4+ VIEW  COMPARISON:  None.  FINDINGS: Mild levoscoliosis in the lumbar spine.  Mild fracture of L2 involving the superior endplate which appears acute.  Mild fracture of L4 of indeterminate age. Mild disc degeneration in the lumbar spine.  IMPRESSION: Mild L2 fracture which appears acute.  Mild fracture L4 of indeterminate age.   Electronically Signed   By: Marlan Palau M.D.   On: 02/13/2013 17:52   Dg Pelvis 1-2 Views  02/13/2013   CLINICAL DATA:  Pain post fall  EXAM: PELVIS - 1-2 VIEW  COMPARISON:  PET-CT  06/20/2012  FINDINGS: There is no evidence of pelvic fracture or diastasis. No other pelvic bone lesions are seen. Surgical clips in the pelvis. Mild degenerative changes in the visualized lower lumbar spine.  IMPRESSION: Negative.   Electronically Signed   By: Oley Balm M.D.   On: 02/13/2013 19:10   Ct Head Wo Contrast  02/13/2013   CLINICAL DATA:  Fall  EXAM: CT HEAD WITHOUT CONTRAST  CT CERVICAL SPINE WITHOUT CONTRAST  TECHNIQUE: Multidetector CT imaging of the head and cervical spine was performed following the standard protocol without intravenous contrast.  Multiplanar CT image reconstructions of the cervical spine were also generated.  COMPARISON:  None.  FINDINGS: CT HEAD FINDINGS  Motion degraded images.  No evidence of parenchymal hemorrhage or extra-axial fluid collection. No mass lesion, mass effect, or midline shift. No CT evidence of acute infarction.  Subcortical white matter and periventricular small vessel ischemic changes. Intracranial atherosclerosis.  Global cortical atrophy, likely age related. Secondary ventricular prominence.  The visualized paranasal sinuses are essentially clear. The mastoid air cells are unopacified.  Prior right craniotomy. No evidence of calvarial fracture.  CT CERVICAL SPINE FINDINGS  Motion degraded images.  Repeat imaging at C1-2 was performed.  No evidence of fracture or dislocation. Vertebral body heights are maintained. Dens appears intact (on the repeat imaging).  No prevertebral soft tissue swelling.  Mild to moderate multilevel degenerative changes.  Visualized thyroid gland grossly unremarkable.  Visualized lung apices are clear.  IMPRESSION: Motion degraded images.  No evidence of acute intracranial abnormality. Age related atrophy with small vessel ischemic changes and intracranial atherosclerosis.  No evidence of traumatic injury to the cervical spine. Mild to moderate degenerative changes.   Electronically Signed   By: Charline Bills M.D.   On: 02/13/2013 18:07   Ct Cervical Spine Wo Contrast  02/13/2013   CLINICAL DATA:  Fall  EXAM: CT HEAD WITHOUT CONTRAST  CT CERVICAL SPINE WITHOUT CONTRAST  TECHNIQUE: Multidetector CT imaging of the head and cervical spine was performed following the standard protocol without intravenous contrast. Multiplanar CT image reconstructions of the cervical spine were also generated.  COMPARISON:  None.  FINDINGS: CT HEAD FINDINGS  Motion degraded images.  No evidence of parenchymal hemorrhage or extra-axial fluid collection. No mass lesion, mass effect, or midline shift. No CT  evidence of acute infarction.  Subcortical white matter and periventricular small vessel ischemic changes. Intracranial atherosclerosis.  Global cortical atrophy, likely age related. Secondary ventricular prominence.  The visualized paranasal sinuses are essentially clear. The mastoid air cells are unopacified.  Prior right craniotomy. No evidence of calvarial fracture.  CT CERVICAL SPINE FINDINGS  Motion degraded images.  Repeat imaging at C1-2 was performed.  No evidence of fracture or dislocation. Vertebral body heights are maintained. Dens appears intact (on the repeat imaging).  No prevertebral soft tissue swelling.  Mild to moderate multilevel degenerative changes.  Visualized thyroid gland grossly unremarkable.  Visualized lung apices are clear.  IMPRESSION: Motion degraded images.  No evidence of acute intracranial abnormality. Age related atrophy with small vessel ischemic changes and intracranial atherosclerosis.  No evidence of traumatic injury to the cervical spine. Mild to moderate degenerative changes.   Electronically Signed   By: Charline Bills M.D.   On: 02/13/2013 18:07    Assessment/Plan Status post fall  Active Problems:   Dementia   Hypertension   Hyponatremia   UTI (urinary tract infection)  79 year old female with multiple medical problems brought to the hospital status post fall  and sustained mild acute L2 fracture, will start on fentanyl 12.5 mcg every 72 hours along with Vicodin when necessary. Patient might benefit from kyphoplasty, daughter wants to discuss with Dr. Charlean Merl in the morning. EKG shows normal sinus rhythm. First set of cardiac enzymes is negative, will obtain 3 more sets of cardiac enzymes. Patient also has abnormal UA but nitrite is negative, will empirically start her on Rocephin and await urine culture results. Patient has hyponatremia, appears dehydrated, will start her on normal saline at 75 mL per hour and obtain serial possibility. She also has  questionable left rib fractures we'll obtain left rib series.  Code status: Patient is full code  Family discussion: Discussed with patient's daughter at bedside   Time Spent on Admission: 65 min  [facility] Pager: [phone] 02/13/2013, 8:03 PM  If 7PM-7AM, please contact night-coverage  [email or web address]  Password [email or web address]

## 2013-02-14 LAB — BASIC METABOLIC PANEL
BUN: 21 mg/dL (ref 6–23)
CO2: 25 mEq/L (ref 19–32)
CO2: 25 mEq/L (ref 19–32)
Calcium: 9.4 mg/dL (ref 8.4–10.5)
Chloride: 89 mEq/L — ABNORMAL LOW (ref 96–112)
Chloride: 93 mEq/L — ABNORMAL LOW (ref 96–112)
Creatinine, Ser: 0.96 mg/dL (ref 0.50–1.10)
GFR calc Af Amer: 65 mL/min — ABNORMAL LOW (ref >90)
GFR calc non Af Amer: 56 mL/min — ABNORMAL LOW (ref >90)
Glucose, Bld: 122 mg/dL — ABNORMAL HIGH (ref 70–99)
Glucose, Bld: 106 mg/dL — ABNORMAL HIGH (ref 70–99)
Potassium: 4.1 mEq/L (ref 3.5–5.1)
Sodium: 129 mEq/L — ABNORMAL LOW (ref 135–145)

## 2013-02-14 LAB — COMPREHENSIVE METABOLIC PANEL
ALT: 10 U/L (ref 0–35)
AST: 14 U/L (ref 0–37)
Albumin: 3.2 g/dL — ABNORMAL LOW (ref 3.5–5.2)
Alkaline Phosphatase: 22 U/L — ABNORMAL LOW (ref 39–117)
Calcium: 9.3 mg/dL (ref 8.4–10.5)
GFR calc Af Amer: 61 mL/min — ABNORMAL LOW (ref >90)
Glucose, Bld: 117 mg/dL — ABNORMAL HIGH (ref 70–99)
Potassium: 4.1 mEq/L (ref 3.5–5.1)
Sodium: 126 mEq/L — ABNORMAL LOW (ref 135–145)
Total Bilirubin: 0.4 mg/dL (ref 0.3–1.2)
Total Protein: 7 g/dL (ref 6.0–8.3)

## 2013-02-14 LAB — TROPONIN I
Troponin I: <0.3 ng/mL (ref <0.30)
Troponin I: <0.3 ng/mL (ref <0.30)

## 2013-02-14 LAB — CBC
Hemoglobin: 10.6 g/dL — ABNORMAL LOW (ref 12.0–15.0)
MCHC: 35 g/dL (ref 30.0–36.0)
MCV: 89.9 fL (ref 78.0–100.0)
Platelets: 200 10*3/uL (ref 150–400)
RBC: 3.37 MIL/uL — ABNORMAL LOW (ref 3.87–5.11)
RDW: 12.7 % (ref 11.5–15.5)

## 2013-02-14 MED ORDER — CLONAZEPAM 0.5 MG PO TABS
0.2500 mg | ORAL_TABLET | Freq: Every day | ORAL | Status: DC
  Administered 2013-02-14 – 2013-02-19 (×6): 0.25 mg via ORAL
  Filled 2013-02-14 (×6): qty 1

## 2013-02-14 NOTE — Progress Notes (Signed)
Subjective: Patient was admitted from [facility] nursing due to accidental fall. She was found to have UTI and hyponatremia.  Objective: Vital signs in last 24 hours: Temp:  [97.8 F (36.6 C)-97.9 F (36.6 C)] 97.8 F (36.6 C) (12/23 1958) Pulse Rate:  [56-76] 56 (12/24 0829) Resp:  [20] 20 (12/23 1958) BP: (114-148)/(45-61) 114/61 mmHg (12/24 0829) SpO2:  [95 %] 95 % (12/23 1958) Weight:  [72.9 kg (160 lb 11.5 oz)-81.647 kg (180 lb)] 72.9 kg (160 lb 11.5 oz) (12/23 1958) Weight change:     Intake/Output from previous day:    PHYSICAL EXAM General appearance: cooperative and no distress Resp: clear to auscultation bilaterally Cardio: S1, S2 normal GI: soft, non-tender; bowel sounds normal; no masses,  no organomegaly Extremities: extremities normal, atraumatic, no cyanosis or edema  Lab Results:    @labtest @ [facility] No results found for this basename: PHART, PCO2, PO2ART, TCO2, HCO3,  in the last 72 hours CULTURES No results found for this or any previous visit (from the past 240 hour(s)). Studies/Results: Dg Chest 1 View  02/13/2013   CLINICAL DATA:  Fall today  EXAM: CHEST - 1 VIEW  COMPARISON:  CT chest 12/13/2012  FINDINGS: Left lower lobe airspace consolidation was not present previously. This has a somewhat linear configuration and may be atelectasis however pneumonia not excluded. Left rib fractures not excluded but not definitely diagnosed . Left rib series may be helpful if there is pain in this area.  Right lung is clear.  Negative for heart failure.  IMPRESSION: Question left rib fractures. Left lower lobe atelectasis is present.   Electronically Signed   By: Marlan Palau M.D.   On: 02/13/2013 17:50   Dg Thoracic Spine 2 View  02/13/2013   CLINICAL DATA:  Fall  EXAM: THORACIC SPINE - 2 VIEW  COMPARISON:  None.  FINDINGS: Negative for thoracic fracture. No mass lesion. Mild degenerative changes in the thoracic spine with mild scoliosis.  IMPRESSION: Negative.    Electronically Signed   By: Marlan Palau M.D.   On: 02/13/2013 17:51   Dg Lumbar Spine Complete  02/13/2013   CLINICAL DATA:  Fall.  EXAM: LUMBAR SPINE - COMPLETE 4+ VIEW  COMPARISON:  None.  FINDINGS: Mild levoscoliosis in the lumbar spine.  Mild fracture of L2 involving the superior endplate which appears acute.  Mild fracture of L4 of indeterminate age. Mild disc degeneration in the lumbar spine.  IMPRESSION: Mild L2 fracture which appears acute.  Mild fracture L4 of indeterminate age.   Electronically Signed   By: Marlan Palau M.D.   On: 02/13/2013 17:52   Dg Pelvis 1-2 Views  02/13/2013   CLINICAL DATA:  Pain post fall  EXAM: PELVIS - 1-2 VIEW  COMPARISON:  PET-CT 06/20/2012  FINDINGS: There is no evidence of pelvic fracture or diastasis. No other pelvic bone lesions are seen. Surgical clips in the pelvis. Mild degenerative changes in the visualized lower lumbar spine.  IMPRESSION: Negative.   Electronically Signed   By: Oley Balm M.D.   On: 02/13/2013 19:10   Ct Head Wo Contrast  02/13/2013   CLINICAL DATA:  Fall  EXAM: CT HEAD WITHOUT CONTRAST  CT CERVICAL SPINE WITHOUT CONTRAST  TECHNIQUE: Multidetector CT imaging of the head and cervical spine was performed following the standard protocol without intravenous contrast. Multiplanar CT image reconstructions of the cervical spine were also generated.  COMPARISON:  None.  FINDINGS: CT HEAD FINDINGS  Motion degraded images.  No evidence of parenchymal hemorrhage  or extra-axial fluid collection. No mass lesion, mass effect, or midline shift. No CT evidence of acute infarction.  Subcortical white matter and periventricular small vessel ischemic changes. Intracranial atherosclerosis.  Global cortical atrophy, likely age related. Secondary ventricular prominence.  The visualized paranasal sinuses are essentially clear. The mastoid air cells are unopacified.  Prior right craniotomy. No evidence of calvarial fracture.  CT CERVICAL SPINE FINDINGS   Motion degraded images.  Repeat imaging at C1-2 was performed.  No evidence of fracture or dislocation. Vertebral body heights are maintained. Dens appears intact (on the repeat imaging).  No prevertebral soft tissue swelling.  Mild to moderate multilevel degenerative changes.  Visualized thyroid gland grossly unremarkable.  Visualized lung apices are clear.  IMPRESSION: Motion degraded images.  No evidence of acute intracranial abnormality. Age related atrophy with small vessel ischemic changes and intracranial atherosclerosis.  No evidence of traumatic injury to the cervical spine. Mild to moderate degenerative changes.   Electronically Signed   By: Charline Bills M.D.   On: 02/13/2013 18:07   Ct Cervical Spine Wo Contrast  02/13/2013   CLINICAL DATA:  Fall  EXAM: CT HEAD WITHOUT CONTRAST  CT CERVICAL SPINE WITHOUT CONTRAST  TECHNIQUE: Multidetector CT imaging of the head and cervical spine was performed following the standard protocol without intravenous contrast. Multiplanar CT image reconstructions of the cervical spine were also generated.  COMPARISON:  None.  FINDINGS: CT HEAD FINDINGS  Motion degraded images.  No evidence of parenchymal hemorrhage or extra-axial fluid collection. No mass lesion, mass effect, or midline shift. No CT evidence of acute infarction.  Subcortical white matter and periventricular small vessel ischemic changes. Intracranial atherosclerosis.  Global cortical atrophy, likely age related. Secondary ventricular prominence.  The visualized paranasal sinuses are essentially clear. The mastoid air cells are unopacified.  Prior right craniotomy. No evidence of calvarial fracture.  CT CERVICAL SPINE FINDINGS  Motion degraded images.  Repeat imaging at C1-2 was performed.  No evidence of fracture or dislocation. Vertebral body heights are maintained. Dens appears intact (on the repeat imaging).  No prevertebral soft tissue swelling.  Mild to moderate multilevel degenerative changes.   Visualized thyroid gland grossly unremarkable.  Visualized lung apices are clear.  IMPRESSION: Motion degraded images.  No evidence of acute intracranial abnormality. Age related atrophy with small vessel ischemic changes and intracranial atherosclerosis.  No evidence of traumatic injury to the cervical spine. Mild to moderate degenerative changes.   Electronically Signed   By: Charline Bills M.D.   On: 02/13/2013 18:07    Medications: I have reviewed the patient's current medications.  Assesment: Active Problems:   Dementia   Hypertension   Hyponatremia   UTI (urinary tract infection)    Plan: Will continue Iv hydration Continue pain management Will monitor BMP Continue regular treatment.    LOS: 1 day   [name] 02/14/2013, 10:19 AM

## 2013-02-14 NOTE — Clinical Social Work Psychosocial (Signed)
Clinical Social Work Department BRIEF PSYCHOSOCIAL ASSESSMENT 02/14/2013  Patient:  [name], [name]     Account Number:  [id number]     Admit date:  02/13/2013  Clinical Social Worker:  Nancie Neas  Date/Time:  02/14/2013 10:50 AM  Referred by:  CSW  Date Referred:  02/14/2013 Referred for  SNF Placement   Other Referral:   Interview type:  Family Other interview type:   Toniann Fail- daughter    PSYCHOSOCIAL DATA Living Status:  FACILITY Admitted from facility:  [facility] Level of care:  Skilled Nursing Facility Primary support name:  Toniann Fail Primary support relationship to patient:  CHILD, ADULT Degree of support available:    CURRENT CONCERNS Current Concerns  Post-Acute Placement   Other Concerns:    SOCIAL WORK ASSESSMENT / PLAN CSW spoke with pt's daughter on phone as pt sleeping and is oriented to self only per chart. Toniann Fail reports pt has been a resident at [facility] for over a year. Pt fell yesterday at facility. Family is very involved and supportive. Toniann Fail expresses some concern regarding care at [facility] and said that she had spoken to several staff there regarding issues. She asked about placement at [facility], but was made aware that they are not accepting new admissions currently. CSW offered placement outside of [facility] but Toniann Fail would like to keep her here and requests return to [facility]. Some of her family lives in [place] and Toniann Fail may pursue this in the future, but is not interested at this time. Per Eunice Blase at [facility], pt is nursing level of care and requires extensive assist with ADLs. Okay to return at d/c. She uses a wheelchair. Toniann Fail reports pt requires a lift for transfers. Possible d/c tomorrow per MD. Facility agreeable to return on holiday.   Assessment/plan status:  Psychosocial Support/Ongoing Assessment of Needs Other assessment/ plan:   Information/referral to community resources:   [facility]    PATIENT'S/FAMILY'S RESPONSE TO PLAN OF CARE: Pt  unable to discuss plan of care. Planned return to [facility] possibly tomorrow.       Derenda Fennel, [place] [phone]

## 2013-02-14 NOTE — Progress Notes (Signed)
Notified Dr. Sharl Ma of loss of IV access.  Patient difficult IV stick, can only stick left arm.  Can leave IV out for now, will reaccess in am.

## 2013-02-14 NOTE — Progress Notes (Signed)
Utilization Review Complete  

## 2013-02-14 NOTE — Care Management Note (Addendum)
    Page 1 of 1   02/19/2013     1:25:19 PM   CARE MANAGEMENT NOTE 02/19/2013  Patient:  [name], [name]   Account Number:  [id number]  Date Initiated:  02/14/2013  Documentation initiated by:  Rosemary Holms  Subjective/Objective Assessment:   Pt admitted from [facility] and plans to return at DC. CSW co-ordinating disposition.     Action/Plan:   Anticipated DC Date:  02/15/2013   Anticipated DC Plan:  SKILLED NURSING FACILITY  In-house referral  Clinical Social Worker      DC Planning Services  CM consult      Choice offered to / List presented to:             Status of service:  Completed, signed off Medicare Important Message given?   (If response is "NO", the following Medicare IM given date fields will be blank) Date Medicare IM given:   Date Additional Medicare IM given:    Discharge Disposition:  SKILLED NURSING FACILITY  Per UR Regulation:    If discussed at Long Length of Stay Meetings, dates discussed:    Comments:  02/19/13 Rosemary Holms RN BSN CM DC to [facility]  02/14/13 Srija Southard RN BSN CM

## 2013-02-14 NOTE — Progress Notes (Signed)
Patient has right breast mastectomy.  No iv sticks ro right arm.  Skin tears to left elbow and left knee, scratches to buttocks.  All present on admission. Skin tears from fall.

## 2013-02-15 LAB — URINE CULTURE: Colony Count: >=100000

## 2013-02-15 NOTE — Progress Notes (Signed)
[id number] 

## 2013-02-15 NOTE — Progress Notes (Signed)
NAMELEAHMARIE, GASIOROWSKI NO.:  [id number]  MEDICAL RECORD NO.:  [id number]  LOCATION:  A331                          FACILITY:  [facility]  PHYSICIAN:  Melvyn Novas, MDDATE OF BIRTH:  1923/01/09  DATE OF PROCEDURE:  02/15/2013 DATE OF DISCHARGE:                                PROGRESS NOTE   SUBJECTIVE:  The patient is an 79 year old lady, status post fall, has hyponatremia, dementia, and UTI currently treated with Rocephin.  OBJECTIVE:  VITAL SIGNS:  Blood pressure 130/48, temperature 98.1, respiratory rate is 18. LUNGS:  Clear.  No rales, wheezes, or rhonchi. HEART:  Regular rate and rhythm.  No murmurs, gallops, or rubs. ABDOMEN:  Soft, nontender.  Bowel sounds normoactive.  LABORATORY DATA:  Hemoglobin is 10.6.  Sodium up from 126 to 129. Creatinine 0.89.  PLAN:  Right now is to continue normal saline infusion, monitor BMET in a.m.  Continue IV Rocephin and analgesia as already ordered.     Melvyn Novas, MD     RMD/MEDQ  D:  02/15/2013  T:  02/15/2013  Job:  [id number]

## 2013-02-15 NOTE — Progress Notes (Signed)
Notified dr. Janna Arch that pt c/o chest pain on the left side. She states that it has been ongoing for a couple of days, however, she has not stated pain in chest to me today, but has rather said that her back and side hurt and complained of pain all over. 12 lead EKG ordered per protocol. It is important to note that this pt does have hx of dementia. VS are stable, and telemetry has maintained NSR. No further orders received at this time. Nursing will manage pain. Sheryn Bison

## 2013-02-16 LAB — BASIC METABOLIC PANEL
BUN: 11 mg/dL (ref 6–23)
Calcium: 8.4 mg/dL (ref 8.4–10.5)
Chloride: 96 mEq/L (ref 96–112)
Creatinine, Ser: 0.75 mg/dL (ref 0.50–1.10)
GFR calc non Af Amer: 73 mL/min — ABNORMAL LOW (ref >90)
Potassium: 4.2 mEq/L (ref 3.5–5.1)

## 2013-02-16 NOTE — Clinical Social Work Note (Signed)
CSW reviewed chart. Spoke with pt's daughter and notified her that [facility] is still not accepting new admissions. Offered to begin new bed search if desired, but daughter requests return to [facility] if [facility] is not available. Okay for return over weekend if stable.   Derenda Fennel, [place] [phone]

## 2013-02-16 NOTE — Progress Notes (Signed)
Subjective: Patient rteamined very weak and deconditioned. She is growing E.coli in her urine. Her po intake remained very poor.  Objective: Vital signs in last 24 hours: Temp:  [98 F (36.7 C)-98.5 F (36.9 C)] 98 F (36.7 C) (12/26 0552) Pulse Rate:  [76-88] 88 (12/26 0552) Resp:  [16-22] 16 (12/26 0552) BP: (125-152)/(46-73) 143/73 mmHg (12/26 0552) SpO2:  [94 %-98 %] 96 % (12/26 0552) Weight change:  Last BM Date:  (unknown)  Intake/Output from previous day: 12/25 0701 - 12/26 0700 In: 2296.3 [P.O.:480; I.V.:1766.3; IV Piggyback:50] Out: -   PHYSICAL EXAM General appearance: cooperative and no distress Resp: clear to auscultation bilaterally Cardio: S1, S2 normal GI: soft, non-tender; bowel sounds normal; no masses,  no organomegaly Extremities: extremities normal, atraumatic, no cyanosis or edema  Lab Results:    @labtest @ [facility] No results found for this basename: PHART, PCO2, PO2ART, TCO2, HCO3,  in the last 72 hours CULTURES Recent Results (from the past 240 hour(s))  URINE CULTURE     Status: None   Collection Time    02/13/13  6:08 PM      Result Value Range Status   Specimen Description URINE, CATHETERIZED   Final   Special Requests NONE   Final   Culture  Setup Time     Final   Value: 02/13/2013 18:50     Performed at [facility] Count     Final   Value: >=100,000 COLONIES/ML     Performed at [facility]   Culture     Final   Value: ESCHERICHIA COLI     Performed at [facility]   Report Status 02/15/2013 FINAL   Final   Organism ID, Bacteria ESCHERICHIA COLI   Final   Studies/Results: No results found.  Medications: I have reviewed the patient's current medications.  Assesment: Active Problems:   Dementia   Hypertension   Hyponatremia   UTI (urinary tract infection)    Plan: Will continue Iv hydration Continue pain management Will monitor BMP Continue iv antibiotics.    LOS: 3 days    Talibah Colasurdo 02/16/2013, 7:52 AM

## 2013-02-16 NOTE — Progress Notes (Signed)
Assumed care of patient at this time

## 2013-02-17 LAB — BASIC METABOLIC PANEL
BUN: 12 mg/dL (ref 6–23)
CO2: 22 mEq/L (ref 19–32)
Calcium: 8.6 mg/dL (ref 8.4–10.5)
Chloride: 96 mEq/L (ref 96–112)
GFR calc Af Amer: 71 mL/min — ABNORMAL LOW (ref >90)
GFR calc non Af Amer: 61 mL/min — ABNORMAL LOW (ref >90)
Glucose, Bld: 87 mg/dL (ref 70–99)
Potassium: 3.8 mEq/L (ref 3.5–5.1)
Sodium: 128 mEq/L — ABNORMAL LOW (ref 135–145)

## 2013-02-17 MED ORDER — SODIUM CHLORIDE 1 G PO TABS
1.0000 g | ORAL_TABLET | Freq: Three times a day (TID) | ORAL | Status: DC
  Administered 2013-02-17 – 2013-02-19 (×6): 1 g via ORAL
  Filled 2013-02-17 (×7): qty 1

## 2013-02-17 MED ORDER — FENTANYL 25 MCG/HR TD PT72
25.0000 ug | MEDICATED_PATCH | TRANSDERMAL | Status: DC
  Administered 2013-02-17: 25 ug via TRANSDERMAL
  Filled 2013-02-17: qty 1

## 2013-02-17 NOTE — Progress Notes (Signed)
Subjective: Patient is more alert. She feels better except for back pain.  Objective: Vital signs in last 24 hours: Temp:  [97.5 F (36.4 C)-98 F (36.7 C)] 97.6 F (36.4 C) (12/27 0700) Pulse Rate:  [68-75] 73 (12/27 0700) Resp:  [18] 18 (12/27 0700) BP: (116-131)/(47-77) 116/71 mmHg (12/27 0700) SpO2:  [94 %-100 %] 95 % (12/27 0700) Weight change:  Last BM Date: 02/13/13  Intake/Output from previous day: 12/26 0701 - 12/27 0700 In: 2400 [P.O.:600; I.V.:1800] Out: -   PHYSICAL EXAM General appearance: cooperative and no distress Resp: clear to auscultation bilaterally Cardio: S1, S2 normal GI: soft, non-tender; bowel sounds normal; no masses,  no organomegaly Extremities: extremities normal, atraumatic, no cyanosis or edema  Lab Results:    @labtest @ [facility] No results found for this basename: PHART, PCO2, PO2ART, TCO2, HCO3,  in the last 72 hours CULTURES Recent Results (from the past 240 hour(s))  URINE CULTURE     Status: None   Collection Time    02/13/13  6:08 PM      Result Value Range Status   Specimen Description URINE, CATHETERIZED   Final   Special Requests NONE   Final   Culture  Setup Time     Final   Value: 02/13/2013 18:50     Performed at [facility] Count     Final   Value: >=100,000 COLONIES/ML     Performed at [facility]   Culture     Final   Value: ESCHERICHIA COLI     Performed at [facility]   Report Status 02/15/2013 FINAL   Final   Organism ID, Bacteria ESCHERICHIA COLI   Final   Studies/Results: No results found.  Medications: I have reviewed the patient's current medications.  Assesment: Active Problems:   Dementia   Hypertension   Hyponatremia   UTI (urinary tract infection)    Plan: Will continue Iv hydration Continue pain management Will monitor BMP Continue iv antibiotics. Will start on sodium tablet   LOS: 4 days   Devone Tousley 02/17/2013, 8:49 AM

## 2013-02-18 LAB — BASIC METABOLIC PANEL WITH GFR
BUN: 10 mg/dL (ref 6–23)
CO2: 20 meq/L (ref 19–32)
Calcium: 8.5 mg/dL (ref 8.4–10.5)
Chloride: 96 meq/L (ref 96–112)
Creatinine, Ser: 0.8 mg/dL (ref 0.50–1.10)
GFR calc Af Amer: 74 mL/min — ABNORMAL LOW (ref >90)
GFR calc non Af Amer: 64 mL/min — ABNORMAL LOW (ref >90)
Glucose, Bld: 95 mg/dL (ref 70–99)
Potassium: 3.9 meq/L (ref 3.5–5.1)
Sodium: 127 meq/L — ABNORMAL LOW (ref 135–145)

## 2013-02-18 MED ORDER — FENTANYL 50 MCG/HR TD PT72: 50.0000 ug | MEDICATED_PATCH | TRANSDERMAL | Status: DC

## 2013-02-18 NOTE — Progress Notes (Signed)
Subjective: Patient continue to complain of of back pain.  Objective: Vital signs in last 24 hours: Temp:  [97.6 F (36.4 C)-98.2 F (36.8 C)] 98.2 F (36.8 C) (12/28 0500) Pulse Rate:  [65-79] 76 (12/28 0500) Resp:  [17-19] 19 (12/28 0500) BP: (116-149)/(39-61) 149/53 mmHg (12/28 0500) SpO2:  [95 %-97 %] 97 % (12/28 0500) Weight change:  Last BM Date: 02/13/13  Intake/Output from previous day: 12/27 0701 - 12/28 0700 In: 1020 [P.O.:120; I.V.:900] Out: -   PHYSICAL EXAM General appearance: cooperative and no distress Resp: clear to auscultation bilaterally Cardio: S1, S2 normal GI: soft, non-tender; bowel sounds normal; no masses,  no organomegaly Extremities: extremities normal, atraumatic, no cyanosis or edema  Lab Results:    @labtest @ [facility] No results found for this basename: PHART, PCO2, PO2ART, TCO2, HCO3,  in the last 72 hours CULTURES Recent Results (from the past 240 hour(s))  URINE CULTURE     Status: None   Collection Time    02/13/13  6:08 PM      Result Value Range Status   Specimen Description URINE, CATHETERIZED   Final   Special Requests NONE   Final   Culture  Setup Time     Final   Value: 02/13/2013 18:50     Performed at [facility] Count     Final   Value: >=100,000 COLONIES/ML     Performed at [facility]   Culture     Final   Value: ESCHERICHIA COLI     Performed at [facility]   Report Status 02/15/2013 FINAL   Final   Organism ID, Bacteria ESCHERICHIA COLI   Final   Studies/Results: No results found.  Medications: I have reviewed the patient's current medications.  Assesment: Active Problems:   Dementia   Hypertension   Hyponatremia   UTI (urinary tract infection)    Plan: Will continue Iv hydration Continue pain management Will monitor BMP Continue iv antibiotics. Will start on sodium tablet   LOS: 5 days   Anmarie Fukushima 02/18/2013, 8:59 AM

## 2013-02-19 LAB — BASIC METABOLIC PANEL
CO2: 19 mEq/L (ref 19–32)
Chloride: 100 mEq/L (ref 96–112)
Glucose, Bld: 113 mg/dL — ABNORMAL HIGH (ref 70–99)
Sodium: 131 mEq/L — ABNORMAL LOW (ref 135–145)

## 2013-02-19 MED ORDER — CLONAZEPAM 0.25 MG PO TBDP: 0.2500 mg | ORAL_TABLET | Freq: Every day | ORAL | Status: DC

## 2013-02-19 MED ORDER — FENTANYL 50 MCG/HR TD PT72: 50.0000 ug | MEDICATED_PATCH | TRANSDERMAL | Status: DC

## 2013-02-19 MED ORDER — HYDROCODONE-ACETAMINOPHEN 5-325 MG PO TABS: 1.0000 | ORAL_TABLET | ORAL | Status: AC | PRN

## 2013-02-19 NOTE — Progress Notes (Signed)
Report called to Crystal at [facility], pt stable at this time. EMS here to transport patient.

## 2013-02-19 NOTE — Clinical Social Work Note (Signed)
Pt d/c today back to [facility]. Pt's granddaughter, French Ana notified as daughter's phone is not working. Facility aware and agreeable. Pt to transfer via [facility]. D/C summary faxed.  Derenda Fennel, [place] [phone]

## 2013-02-19 NOTE — Discharge Summary (Signed)
Physician Discharge Summary  Patient ID: [name] MRN: [id number] DOB/AGE: Aug 08, 1922 79 y.o. Primary Care Physician:Lassie Demorest, MD Admit date: 02/13/2013 Discharge date: 02/19/2013    Discharge Diagnoses:   Active Problems:   Dementia   Hypertension   Hyponatremia   UTI (urinary tract infection) L2 fracture    Medication List         acetaminophen 500 MG tablet  Commonly known as:  TYLENOL  Take 500 mg by mouth every 6 (six) hours as needed for pain.     amLODipine 10 MG tablet  Commonly known as:  NORVASC  Take 10 mg by mouth daily.     beta carotene w/minerals tablet  Take 1 tablet by mouth daily.     cholecalciferol 1000 UNITS tablet  Commonly known as:  VITAMIN D  Take 1,000 Units by mouth 2 (two) times daily.     clonazePAM 0.25 MG disintegrating tablet  Commonly known as:  KLONOPIN  Take 1 tablet (0.25 mg total) by mouth daily.     COLACE 100 MG capsule  Generic drug:  docusate sodium  Take 100 mg by mouth 2 (two) times daily.     enalapril 20 MG tablet  Commonly known as:  VASOTEC  Take 40 mg by mouth daily.     fentaNYL 50 MCG/HR  Commonly known as:  DURAGESIC - dosed mcg/hr  Place 1 patch (50 mcg total) onto the skin every 3 (three) days.  Start taking on:  02/20/2013     furosemide 40 MG tablet  Commonly known as:  LASIX  Take 50 mg by mouth 2 (two) times daily.     HYDROcodone-acetaminophen 5-325 MG per tablet  Commonly known as:  NORCO/VICODIN  Take 1-2 tablets by mouth every 4 (four) hours as needed for moderate pain.     POLY-IRON 150 150 MG capsule  Generic drug:  iron polysaccharides  Take 150 mg by mouth daily.        Discharged Condition: improved    Consults: none   Significant Diagnostic Studies: Dg Chest 1 View  02/13/2013   CLINICAL DATA:  Fall today  EXAM: CHEST - 1 VIEW  COMPARISON:  CT chest 12/13/2012  FINDINGS: Left lower lobe airspace consolidation was not present previously. This has a somewhat  linear configuration and may be atelectasis however pneumonia not excluded. Left rib fractures not excluded but not definitely diagnosed . Left rib series may be helpful if there is pain in this area.  Right lung is clear.  Negative for heart failure.  IMPRESSION: Question left rib fractures. Left lower lobe atelectasis is present.   Electronically Signed   By: Marlan Palau M.D.   On: 02/13/2013 17:50   Dg Thoracic Spine 2 View  02/13/2013   CLINICAL DATA:  Fall  EXAM: THORACIC SPINE - 2 VIEW  COMPARISON:  None.  FINDINGS: Negative for thoracic fracture. No mass lesion. Mild degenerative changes in the thoracic spine with mild scoliosis.  IMPRESSION: Negative.   Electronically Signed   By: Marlan Palau M.D.   On: 02/13/2013 17:51   Dg Lumbar Spine Complete  02/13/2013   CLINICAL DATA:  Fall.  EXAM: LUMBAR SPINE - COMPLETE 4+ VIEW  COMPARISON:  None.  FINDINGS: Mild levoscoliosis in the lumbar spine.  Mild fracture of L2 involving the superior endplate which appears acute.  Mild fracture of L4 of indeterminate age. Mild disc degeneration in the lumbar spine.  IMPRESSION: Mild L2 fracture which appears acute.  Mild fracture L4  of indeterminate age.   Electronically Signed   By: Marlan Palau M.D.   On: 02/13/2013 17:52   Dg Pelvis 1-2 Views  02/13/2013   CLINICAL DATA:  Pain post fall  EXAM: PELVIS - 1-2 VIEW  COMPARISON:  PET-CT 06/20/2012  FINDINGS: There is no evidence of pelvic fracture or diastasis. No other pelvic bone lesions are seen. Surgical clips in the pelvis. Mild degenerative changes in the visualized lower lumbar spine.  IMPRESSION: Negative.   Electronically Signed   By: Oley Balm M.D.   On: 02/13/2013 19:10   Ct Head Wo Contrast  02/13/2013   CLINICAL DATA:  Fall  EXAM: CT HEAD WITHOUT CONTRAST  CT CERVICAL SPINE WITHOUT CONTRAST  TECHNIQUE: Multidetector CT imaging of the head and cervical spine was performed following the standard protocol without intravenous contrast.  Multiplanar CT image reconstructions of the cervical spine were also generated.  COMPARISON:  None.  FINDINGS: CT HEAD FINDINGS  Motion degraded images.  No evidence of parenchymal hemorrhage or extra-axial fluid collection. No mass lesion, mass effect, or midline shift. No CT evidence of acute infarction.  Subcortical white matter and periventricular small vessel ischemic changes. Intracranial atherosclerosis.  Global cortical atrophy, likely age related. Secondary ventricular prominence.  The visualized paranasal sinuses are essentially clear. The mastoid air cells are unopacified.  Prior right craniotomy. No evidence of calvarial fracture.  CT CERVICAL SPINE FINDINGS  Motion degraded images.  Repeat imaging at C1-2 was performed.  No evidence of fracture or dislocation. Vertebral body heights are maintained. Dens appears intact (on the repeat imaging).  No prevertebral soft tissue swelling.  Mild to moderate multilevel degenerative changes.  Visualized thyroid gland grossly unremarkable.  Visualized lung apices are clear.  IMPRESSION: Motion degraded images.  No evidence of acute intracranial abnormality. Age related atrophy with small vessel ischemic changes and intracranial atherosclerosis.  No evidence of traumatic injury to the cervical spine. Mild to moderate degenerative changes.   Electronically Signed   By: Charline Bills M.D.   On: 02/13/2013 18:07   Ct Cervical Spine Wo Contrast  02/13/2013   CLINICAL DATA:  Fall  EXAM: CT HEAD WITHOUT CONTRAST  CT CERVICAL SPINE WITHOUT CONTRAST  TECHNIQUE: Multidetector CT imaging of the head and cervical spine was performed following the standard protocol without intravenous contrast. Multiplanar CT image reconstructions of the cervical spine were also generated.  COMPARISON:  None.  FINDINGS: CT HEAD FINDINGS  Motion degraded images.  No evidence of parenchymal hemorrhage or extra-axial fluid collection. No mass lesion, mass effect, or midline shift. No CT  evidence of acute infarction.  Subcortical white matter and periventricular small vessel ischemic changes. Intracranial atherosclerosis.  Global cortical atrophy, likely age related. Secondary ventricular prominence.  The visualized paranasal sinuses are essentially clear. The mastoid air cells are unopacified.  Prior right craniotomy. No evidence of calvarial fracture.  CT CERVICAL SPINE FINDINGS  Motion degraded images.  Repeat imaging at C1-2 was performed.  No evidence of fracture or dislocation. Vertebral body heights are maintained. Dens appears intact (on the repeat imaging).  No prevertebral soft tissue swelling.  Mild to moderate multilevel degenerative changes.  Visualized thyroid gland grossly unremarkable.  Visualized lung apices are clear.  IMPRESSION: Motion degraded images.  No evidence of acute intracranial abnormality. Age related atrophy with small vessel ischemic changes and intracranial atherosclerosis.  No evidence of traumatic injury to the cervical spine. Mild to moderate degenerative changes.   Electronically Signed   By: Charline Bills  M.D.   On: 02/13/2013 18:07    Lab Results: Basic Metabolic Panel:  Recent Labs  16/10/96 0648 02/19/13 0543  NA 127* 131*  K 3.9 3.9  CL 96 100  CO2 20 19  GLUCOSE 95 113*  BUN 10 8  CREATININE 0.80 0.71  CALCIUM 8.5 8.7   Liver Function Tests: No results found for this basename: AST, ALT, ALKPHOS, BILITOT, PROT, ALBUMIN,  in the last 72 hours   CBC: No results found for this basename: WBC, NEUTROABS, HGB, HCT, MCV, PLT,  in the last 72 hours  Recent Results (from the past 240 hour(s))  URINE CULTURE     Status: None   Collection Time    02/13/13  6:08 PM      Result Value Range Status   Specimen Description URINE, CATHETERIZED   Final   Special Requests NONE   Final   Culture  Setup Time     Final   Value: 02/13/2013 18:50     Performed at [facility] Count     Final   Value: >=100,000 COLONIES/ML      Performed at [facility]   Culture     Final   Value: ESCHERICHIA COLI     Performed at [facility]   Report Status 02/15/2013 FINAL   Final   Organism ID, Bacteria ESCHERICHIA COLI   Final     Hospital Course:  This is an 79 years old female patient from [facility] nursing [facility] due to fall and change in mental status. Patient was found to have UTI , hyponatremia and L2 fracture. Patient was iv antibiotics and normal saline. Her pain is controlled. Patient imprioved and discharge in stable condition.  Discharge Exam: Blood pressure 154/42, pulse 90, temperature 98.3 F (36.8 C), temperature source Oral, resp. rate 17, height 5\' 4"  (1.626 m), weight 72.9 kg (160 lb 11.5 oz), SpO2 95.00%.   Disposition:  [facility]      Signed: Fredric Slabach  02/19/2013, 8:12 AM

## 2013-03-04 ENCOUNTER — Encounter (HOSPITAL_COMMUNITY): Payer: Self-pay | Admitting: Emergency Medicine

## 2013-03-04 ENCOUNTER — Emergency Department (HOSPITAL_COMMUNITY): Payer: PRIVATE HEALTH INSURANCE

## 2013-03-04 ENCOUNTER — Inpatient Hospital Stay (HOSPITAL_COMMUNITY): Payer: PRIVATE HEALTH INSURANCE

## 2013-03-04 ENCOUNTER — Inpatient Hospital Stay (HOSPITAL_COMMUNITY)
Admission: EM | Admit: 2013-03-04 | Discharge: 2013-03-09 | DRG: 193 | Disposition: A | Discharge status: Skilled Nursing Facility | Payer: PRIVATE HEALTH INSURANCE | Attending: Family Medicine | Admitting: Family Medicine

## 2013-03-04 DIAGNOSIS — E860 Dehydration: Secondary | ICD-10-CM | POA: Diagnosis present

## 2013-03-04 DIAGNOSIS — R509 Fever, unspecified: Secondary | ICD-10-CM

## 2013-03-04 DIAGNOSIS — J189 Pneumonia, unspecified organism: Principal | ICD-10-CM | POA: Diagnosis present

## 2013-03-04 DIAGNOSIS — K219 Gastro-esophageal reflux disease without esophagitis: Secondary | ICD-10-CM | POA: Diagnosis present

## 2013-03-04 DIAGNOSIS — N179 Acute kidney failure, unspecified: Secondary | ICD-10-CM | POA: Diagnosis present

## 2013-03-04 DIAGNOSIS — H3530 Unspecified macular degeneration: Secondary | ICD-10-CM | POA: Diagnosis present

## 2013-03-04 DIAGNOSIS — M1990 Unspecified osteoarthritis, unspecified site: Secondary | ICD-10-CM | POA: Diagnosis present

## 2013-03-04 DIAGNOSIS — S32000D Wedge compression fracture of unspecified lumbar vertebra, subsequent encounter for fracture with routine healing: Secondary | ICD-10-CM

## 2013-03-04 DIAGNOSIS — E870 Hyperosmolality and hypernatremia: Secondary | ICD-10-CM | POA: Diagnosis present

## 2013-03-04 DIAGNOSIS — F0390 Unspecified dementia without behavioral disturbance: Secondary | ICD-10-CM | POA: Diagnosis present

## 2013-03-04 DIAGNOSIS — I872 Venous insufficiency (chronic) (peripheral): Secondary | ICD-10-CM | POA: Diagnosis present

## 2013-03-04 DIAGNOSIS — Z853 Personal history of malignant neoplasm of breast: Secondary | ICD-10-CM

## 2013-03-04 DIAGNOSIS — E876 Hypokalemia: Secondary | ICD-10-CM | POA: Diagnosis present

## 2013-03-04 DIAGNOSIS — S2242XA Multiple fractures of ribs, left side, initial encounter for closed fracture: Secondary | ICD-10-CM

## 2013-03-04 DIAGNOSIS — G9340 Encephalopathy, unspecified: Secondary | ICD-10-CM | POA: Diagnosis present

## 2013-03-04 DIAGNOSIS — M810 Age-related osteoporosis without current pathological fracture: Secondary | ICD-10-CM | POA: Diagnosis present

## 2013-03-04 DIAGNOSIS — I10 Essential (primary) hypertension: Secondary | ICD-10-CM | POA: Diagnosis present

## 2013-03-04 DIAGNOSIS — N289 Disorder of kidney and ureter, unspecified: Secondary | ICD-10-CM

## 2013-03-04 DIAGNOSIS — IMO0002 Reserved for concepts with insufficient information to code with codable children: Secondary | ICD-10-CM

## 2013-03-04 HISTORY — DX: Other intervertebral disc degeneration, lumbar region without mention of lumbar back pain or lower extremity pain: M51.369

## 2013-03-04 HISTORY — DX: Wedge compression fracture of unspecified lumbar vertebra, initial encounter for closed fracture: S32.000A

## 2013-03-04 HISTORY — DX: Other intervertebral disc degeneration, lumbar region: M51.36

## 2013-03-04 LAB — CBC WITH DIFFERENTIAL/PLATELET
Basophils Absolute: 0 10*3/uL (ref 0.0–0.1)
Basophils Relative: 0 % (ref 0–1)
EOS ABS: 0 10*3/uL (ref 0.0–0.7)
Eosinophils Relative: 0 % (ref 0–5)
HCT: 34 % — ABNORMAL LOW (ref 36.0–46.0)
HEMOGLOBIN: 11.3 g/dL — AB (ref 12.0–15.0)
LYMPHS ABS: 1 10*3/uL (ref 0.7–4.0)
LYMPHS PCT: 5 % — AB (ref 12–46)
MCH: 30.9 pg (ref 26.0–34.0)
MCHC: 33.2 g/dL (ref 30.0–36.0)
MCV: 92.9 fL (ref 78.0–100.0)
MONOS PCT: 7 % (ref 3–12)
Monocytes Absolute: 1.2 10*3/uL — ABNORMAL HIGH (ref 0.1–1.0)
NEUTROS PCT: 88 % — AB (ref 43–77)
Neutro Abs: 16.5 10*3/uL — ABNORMAL HIGH (ref 1.7–7.7)
Platelets: 311 10*3/uL (ref 150–400)
RBC: 3.66 MIL/uL — AB (ref 3.87–5.11)
RDW: 13.8 % (ref 11.5–15.5)
WBC: 18.7 10*3/uL — AB (ref 4.0–10.5)

## 2013-03-04 LAB — URINALYSIS W MICROSCOPIC + REFLEX CULTURE
BILIRUBIN URINE: NEGATIVE
Glucose, UA: NEGATIVE mg/dL
KETONES UR: NEGATIVE mg/dL
Leukocytes, UA: NEGATIVE
Nitrite: NEGATIVE
Protein, ur: NEGATIVE mg/dL
Specific Gravity, Urine: 1.01 (ref 1.005–1.030)
UROBILINOGEN UA: 0.2 mg/dL (ref 0.0–1.0)
pH: 6 (ref 5.0–8.0)

## 2013-03-04 LAB — COMPREHENSIVE METABOLIC PANEL
ALBUMIN: 3 g/dL — AB (ref 3.5–5.2)
ALT: 21 U/L (ref 0–35)
AST: 53 U/L — AB (ref 0–37)
Alkaline Phosphatase: 30 U/L — ABNORMAL LOW (ref 39–117)
BILIRUBIN TOTAL: 0.6 mg/dL (ref 0.3–1.2)
BUN: 63 mg/dL — ABNORMAL HIGH (ref 6–23)
CHLORIDE: 107 meq/L (ref 96–112)
CO2: 26 meq/L (ref 19–32)
CREATININE: 2.32 mg/dL — AB (ref 0.50–1.10)
Calcium: 8.8 mg/dL (ref 8.4–10.5)
GFR calc Af Amer: 20 mL/min — ABNORMAL LOW (ref >90)
GFR, EST NON AFRICAN AMERICAN: 18 mL/min — AB (ref >90)
Glucose, Bld: 144 mg/dL — ABNORMAL HIGH (ref 70–99)
POTASSIUM: 4.2 meq/L (ref 3.7–5.3)
Sodium: 150 mEq/L — ABNORMAL HIGH (ref 137–147)
Total Protein: 7.1 g/dL (ref 6.0–8.3)

## 2013-03-04 LAB — LACTIC ACID, PLASMA: LACTIC ACID, VENOUS: 1.4 mmol/L (ref 0.5–2.2)

## 2013-03-04 LAB — TROPONIN I: Troponin I: <0.3 ng/mL (ref <0.30)

## 2013-03-04 LAB — D-DIMER, QUANTITATIVE (NOT AT ARMC): D DIMER QUANT: 1.87 ug{FEU}/mL — AB (ref 0.00–0.48)

## 2013-03-04 MED ORDER — PIPERACILLIN-TAZOBACTAM IN DEX 2-0.25 GM/50ML IV SOLN
2.2500 g | Freq: Three times a day (TID) | INTRAVENOUS | Status: DC
  Administered 2013-03-04 – 2013-03-06 (×5): 2.25 g via INTRAVENOUS
  Filled 2013-03-04 (×6): qty 50

## 2013-03-04 MED ORDER — CLONAZEPAM 0.5 MG PO TABS
0.2500 mg | ORAL_TABLET | Freq: Every day | ORAL | Status: DC
  Administered 2013-03-04 – 2013-03-08 (×4): 0.25 mg via ORAL
  Filled 2013-03-04 (×4): qty 1

## 2013-03-04 MED ORDER — LORAZEPAM 2 MG/ML IJ SOLN: 0.2500 mg | Freq: Once | INTRAMUSCULAR | Status: DC

## 2013-03-04 MED ORDER — ACETAMINOPHEN 650 MG RE SUPP
975.0000 mg | Freq: Once | RECTAL | Status: AC
  Administered 2013-03-04: 17:00:00 975 mg via RECTAL
  Filled 2013-03-04 (×2): qty 1

## 2013-03-04 MED ORDER — HYDROCODONE-ACETAMINOPHEN 5-325 MG PO TABS
1.0000 | ORAL_TABLET | ORAL | Status: DC | PRN
  Administered 2013-03-05 (×2): 1 via ORAL
  Administered 2013-03-07 – 2013-03-08 (×2): 2 via ORAL
  Filled 2013-03-04: qty 2
  Filled 2013-03-04 (×2): qty 1
  Filled 2013-03-04: qty 2

## 2013-03-04 MED ORDER — OCUVITE PO TABS
1.0000 | ORAL_TABLET | Freq: Every day | ORAL | Status: DC
  Administered 2013-03-05 – 2013-03-09 (×4): 1 via ORAL
  Filled 2013-03-04 (×8): qty 1

## 2013-03-04 MED ORDER — VANCOMYCIN HCL IN DEXTROSE 1-5 GM/200ML-% IV SOLN
1000.0000 mg | Freq: Once | INTRAVENOUS | Status: AC
  Administered 2013-03-04: 1000 mg via INTRAVENOUS
  Filled 2013-03-04: qty 200

## 2013-03-04 MED ORDER — AMLODIPINE BESYLATE 5 MG PO TABS
10.0000 mg | ORAL_TABLET | Freq: Every day | ORAL | Status: DC
  Administered 2013-03-05 – 2013-03-09 (×3): 10 mg via ORAL
  Filled 2013-03-04 (×4): qty 2

## 2013-03-04 MED ORDER — FENTANYL 12 MCG/HR TD PT72: 12.5000 ug | MEDICATED_PATCH | TRANSDERMAL | Status: DC

## 2013-03-04 MED ORDER — SODIUM CHLORIDE 0.9 % IV SOLN
INTRAVENOUS | Status: DC
  Administered 2013-03-04 – 2013-03-05 (×3): via INTRAVENOUS

## 2013-03-04 MED ORDER — PIPERACILLIN-TAZOBACTAM IN DEX 2-0.25 GM/50ML IV SOLN
INTRAVENOUS | Status: AC
  Filled 2013-03-04: qty 100

## 2013-03-04 MED ORDER — ACETAMINOPHEN 500 MG PO TABS: 500.0000 mg | ORAL_TABLET | Freq: Four times a day (QID) | ORAL | Status: DC | PRN

## 2013-03-04 MED ORDER — VITAMIN D 1000 UNITS PO TABS
1000.0000 [IU] | ORAL_TABLET | Freq: Two times a day (BID) | ORAL | Status: DC
  Administered 2013-03-04 – 2013-03-09 (×7): 1000 [IU] via ORAL
  Filled 2013-03-04 (×11): qty 1

## 2013-03-04 MED ORDER — MORPHINE SULFATE 2 MG/ML IJ SOLN
1.0000 mg | INTRAMUSCULAR | Status: AC | PRN
  Administered 2013-03-04 (×2): 1 mg via INTRAVENOUS
  Filled 2013-03-04 (×2): qty 1

## 2013-03-04 MED ORDER — SODIUM CHLORIDE 0.9 % IV BOLUS (SEPSIS)
250.0000 mL | Freq: Once | INTRAVENOUS | Status: AC
  Administered 2013-03-04: 250 mL via INTRAVENOUS

## 2013-03-04 MED ORDER — POLYSACCHARIDE IRON COMPLEX 150 MG PO CAPS
150.0000 mg | ORAL_CAPSULE | Freq: Every day | ORAL | Status: DC
  Administered 2013-03-05 – 2013-03-09 (×2): 150 mg via ORAL
  Filled 2013-03-04 (×6): qty 1

## 2013-03-04 MED ORDER — ENALAPRIL MALEATE 5 MG PO TABS
40.0000 mg | ORAL_TABLET | Freq: Every day | ORAL | Status: DC
  Administered 2013-03-05 – 2013-03-07 (×2): 40 mg via ORAL
  Filled 2013-03-04 (×2): qty 2
  Filled 2013-03-04 (×2): qty 8

## 2013-03-04 MED ORDER — DEXTROSE 5 % IV SOLN
1.0000 g | Freq: Once | INTRAVENOUS | Status: AC
  Administered 2013-03-04: 1 g via INTRAVENOUS

## 2013-03-04 MED ORDER — SENNA 8.6 MG PO TABS
1.0000 | ORAL_TABLET | Freq: Two times a day (BID) | ORAL | Status: DC
  Administered 2013-03-04 – 2013-03-09 (×7): 8.6 mg via ORAL
  Filled 2013-03-04 (×12): qty 1

## 2013-03-04 NOTE — ED Provider Notes (Signed)
CSN: [id number]     Arrival date & time 03/04/13  1535 History   First MD Initiated Contact with Patient 03/04/13 1538     Chief Complaint  Patient presents with  . Back Pain  . Altered Mental Status    Patient is a 80 y.o. female presenting with back pain and altered mental status. The history is provided by the nursing home and the EMS personnel. The history is limited by the condition of the patient (Hx dementia).  Back Pain Altered Mental Status Pt was seen at 1540. Per EMS and NH report, c/o gradual onset and worsening of persistent AMS and low back "pain" since her discharge from the hospital for same 2 to 3 weeks ago. At that time, pt was dx with hyponatremia and L2 compression fracture sustained after falling. NH states pt has been on a fentanyl patch and been given vicodin q4h without improvement in her pain. Family visiting pt today feels she is "more confused than normal." No reported new falls, no vomiting/diarrhea, no fevers, no cough, no rash. Pt has significant hx of dementia.   Past Medical History  Diagnosis Date  . Breast cancer   . Lung mass   . Venous insufficiency   . Senile macular degeneration   . Subdural hemorrhage following injury, without mention of open intracranial wound, unspecified state of consciousness   . Unspecified kidney injury without mention of open wound into cavity   . Anxiety state, unspecified   . Hyposmolality and/or hyponatremia   . Esophageal reflux   . Hypertension   . Acute renal failure syndrome   . Sepsis   . Dementia   . Mass of lung 06/20/2012  . Compression fracture of lumbar vertebra     L2, L4   . DDD (degenerative disc disease), lumbar    History reviewed. No pertinent past surgical history.  History  Substance Use Topics  . Smoking status: Never Smoker   . Smokeless tobacco: Current User    Types: Snuff  . Alcohol Use: No    Review of Systems  Unable to perform ROS: Dementia  Musculoskeletal: Positive for back pain.     Allergies  Review of patient's allergies indicates no known allergies.  Home Medications   Current Outpatient Rx  Name  Route  Sig  Dispense  Refill  . acetaminophen (TYLENOL) 500 MG tablet   Oral   Take 500 mg by mouth every 6 (six) hours as needed for pain.         Marland Kitchen amLODipine (NORVASC) 10 MG tablet   Oral   Take 10 mg by mouth daily.         . beta carotene w/minerals (OCUVITE) tablet   Oral   Take 1 tablet by mouth daily.         . cholecalciferol (VITAMIN D) 1000 UNITS tablet   Oral   Take 1,000 Units by mouth 2 (two) times daily.          . clonazePAM (KLONOPIN) 0.5 MG tablet   Oral   Take 0.25 mg by mouth daily.         . enalapril (VASOTEC) 20 MG tablet   Oral   Take 40 mg by mouth daily.          . fentaNYL (DURAGESIC - DOSED MCG/HR) 50 MCG/HR   Transdermal   Place 1 patch (50 mcg total) onto the skin every 3 (three) days.   5 patch   0   . furosemide (  LASIX) 40 MG tablet   Oral   Take 50 mg by mouth 2 (two) times daily.          Marland Kitchen HYDROcodone-acetaminophen (NORCO/VICODIN) 5-325 MG per tablet   Oral   Take 1-2 tablets by mouth every 4 (four) hours as needed for moderate pain.   30 tablet   0   . iron polysaccharides (POLY-IRON 150) 150 MG capsule   Oral   Take 150 mg by mouth daily.         Marland Kitchen senna (SENOKOT) 8.6 MG tablet   Oral   Take 1 tablet by mouth 2 (two) times daily.          BP 119/98  Pulse 85  Temp(Src) 101.1 F (38.4 C) (Rectal)  Resp 18  Wt 160 lb (72.576 kg)  SpO2 97% Physical Exam 1545: Physical examination:  Nursing notes reviewed; Vital signs and O2 SAT reviewed;  Constitutional: Well developed, Well nourished, Uncomfortable appearing.; Head:  Normocephalic, atraumatic; Eyes: EOMI, PERRL, No scleral icterus; ENMT: Mouth and pharynx normal, Mucous membranes dry; Neck: Supple, Full range of motion, No lymphadenopathy; Cardiovascular: Tachycardic rate and rhythm, No gallop; Respiratory: Breath sounds clear  & equal bilaterally, No wheezes. No retrax or access mm use. Normal respiratory effort/excursion; Chest: Nontender, Movement normal; Abdomen: Soft, Nontender, Nondistended, Normal bowel sounds; Genitourinary: No CVA tenderness; Spine:  No midline CS, TS, LS tenderness. +TTP left lumbar paraspinal muscles.;; Extremities: Pulses normal, Pelvis stable. No tenderness, No edema, No calf asymmetry.; Neuro: Awake, alert, confused re: time, place, events per hx dementia. No facial droop. Eyes open, Intermittently moaning, speaks few words clearly. Moves all extremities spontaneously on stretcher.; Skin: Color normal, Warm, Dry.   ED Course  Procedures  EKG Interpretation    Date/Time:  Sunday March 04 2013 16:08:57 EST Ventricular Rate:  101 PR Interval:  178 QRS Duration: 96 QT Interval:  356 QTC Calculation: 461 R Axis:   51 Text Interpretation:  Sinus tachycardia Artifact ST \\T \ T wave abnormality Inferior leads Abnormal ECG When compared with ECG of 15-Feb-2013 18:53, T wave inversion now evident in Inferior leads Confirmed by [facility]  MD, Nunzio Cory [phone]) on 03/04/2013 4:25:02 PM            MDM  MDM Reviewed: previous chart, nursing note and vitals Reviewed previous: labs, ECG, x-ray and CT scan Interpretation: labs, ECG, x-ray and CT scan     Results for orders placed during the hospital encounter of 03/04/13  URINALYSIS W MICROSCOPIC + REFLEX CULTURE      Result Value Range   Color, Urine AMBER (*) YELLOW   APPearance CLEAR  CLEAR   Specific Gravity, Urine 1.010  1.005 - 1.030   pH 6.0  5.0 - 8.0   Glucose, UA NEGATIVE  NEGATIVE mg/dL   Hgb urine dipstick TRACE (*) NEGATIVE   Bilirubin Urine NEGATIVE  NEGATIVE   Ketones, ur NEGATIVE  NEGATIVE mg/dL   Protein, ur NEGATIVE  NEGATIVE mg/dL   Urobilinogen, UA 0.2  0.0 - 1.0 mg/dL   Nitrite NEGATIVE  NEGATIVE   Leukocytes, UA NEGATIVE  NEGATIVE   WBC, UA 3-6  <3 WBC/hpf   RBC / HPF 0-2  <3 RBC/hpf   Bacteria, UA RARE   RARE   Squamous Epithelial / LPF RARE  RARE  CBC WITH DIFFERENTIAL      Result Value Range   WBC 18.7 (*) 4.0 - 10.5 K/uL   RBC 3.66 (*) 3.87 - 5.11 MIL/uL   Hemoglobin 11.3 (*)  12.0 - 15.0 g/dL   HCT 34.0 (*) 36.0 - 46.0 %   MCV 92.9  78.0 - 100.0 fL   MCH 30.9  26.0 - 34.0 pg   MCHC 33.2  30.0 - 36.0 g/dL   RDW 13.8  11.5 - 15.5 %   Platelets 311  150 - 400 K/uL   Neutrophils Relative % 88 (*) 43 - 77 %   Neutro Abs 16.5 (*) 1.7 - 7.7 K/uL   Lymphocytes Relative 5 (*) 12 - 46 %   Lymphs Abs 1.0  0.7 - 4.0 K/uL   Monocytes Relative 7  3 - 12 %   Monocytes Absolute 1.2 (*) 0.1 - 1.0 K/uL   Eosinophils Relative 0  0 - 5 %   Eosinophils Absolute 0.0  0.0 - 0.7 K/uL   Basophils Relative 0  0 - 1 %   Basophils Absolute 0.0  0.0 - 0.1 K/uL  TROPONIN I      Result Value Range   Troponin I <0.30  <0.30 ng/mL  LACTIC ACID, PLASMA      Result Value Range   Lactic Acid, Venous 1.4  0.5 - 2.2 mmol/L  COMPREHENSIVE METABOLIC PANEL      Result Value Range   Sodium 150 (*) 137 - 147 mEq/L   Potassium 4.2  3.7 - 5.3 mEq/L   Chloride 107  96 - 112 mEq/L   CO2 26  19 - 32 mEq/L   Glucose, Bld 144 (*) 70 - 99 mg/dL   BUN 63 (*) 6 - 23 mg/dL   Creatinine, Ser 2.32 (*) 0.50 - 1.10 mg/dL   Calcium 8.8  8.4 - 10.5 mg/dL   Total Protein 7.1  6.0 - 8.3 g/dL   Albumin 3.0 (*) 3.5 - 5.2 g/dL   AST 53 (*) 0 - 37 U/L   ALT 21  0 - 35 U/L   Alkaline Phosphatase 30 (*) 39 - 117 U/L   Total Bilirubin 0.6  0.3 - 1.2 mg/dL   GFR calc non Af Amer 18 (*) >90 mL/min   GFR calc Af Amer 20 (*) >90 mL/min   Dg Chest 1 View 03/04/2013   CLINICAL DATA:  Altered mental status, pain  EXAM: CHEST - 1 VIEW  COMPARISON:  02/13/2013  FINDINGS: Chronic interstitial markings. Hazy opacity overlying the left hemithorax, possibly reflecting a layering left pleural effusion, new. No pneumothorax is seen.  Cardiomegaly.  Suspected left lateral 5th-7th rib fractures.  IMPRESSION: Suspected left lateral 5th-7th rib  fractures.  Hazy opacity overlying the left hemithorax, possibly reflecting a layering left pleural effusion, new.  No pneumothorax is seen.   Electronically Signed   By: Julian Hy M.D.   On: 03/04/2013 18:32   Dg Lumbar Spine Complete 03/04/2013   CLINICAL DATA:  Altered mental status, pain, history of lumbar compression fracture  EXAM: LUMBAR SPINE - COMPLETE 4+ VIEW  COMPARISON:  02/13/2013  FINDINGS: Five lumbar type vertebral bodies.  Mild straightening of the lumbar spine.  Stable mild superior endplate compression deformity at L2 and mild to moderate superior endplate compression deformity at L4, chronic.  Additional mild superior endplate changes involving T11 through L1 without appreciable loss of height.  Mild multilevel degenerative changes.  Visualized bony pelvis appears intact.  Surgical clips overlying the bilateral pelvis.  Vascular calcifications.  IMPRESSION: Mild to moderate superior endplate compression deformities at L2 and L4, chronic.  Additional multilevel changes, as described above.  No interval change from recent prior.  Electronically Signed   By: Julian Hy M.D.   On: 03/04/2013 18:37   Ct Head Wo Contrast 03/04/2013   CLINICAL DATA:  Acute mental status changes with confusion after a fall.  EXAM: CT HEAD WITHOUT CONTRAST  TECHNIQUE: Contiguous axial images were obtained from the base of the skull through the vertex without intravenous contrast.  COMPARISON:  02/13/2013.  FINDINGS: Patient motion blurred many of the images initially. These were repeated with persistent motion. The study is therefore less than optimal.  Severe cortical and deep atrophy and mild cerebellar atrophy as noted on the recent previous examination. Moderate changes of small vessel disease of the white matter diffusely. No mass lesion. No midline shift. No acute hemorrhage or hematoma. No extra-axial fluid collections. No evidence of acute infarction.  Bone window images demonstrate prior right  frontotemporal craniotomy. No skull fractures. Visualized paranasal sinuses, bilateral mastoid air cells, and bilateral middle ear cavities well-aerated. Bilateral carotid siphon atherosclerosis.  IMPRESSION: 1. Motion degraded images demonstrate no acute intracranial abnormality. 2. Severe cortical and deep atrophy and mild cerebellar atrophy. Moderate chronic microvascular ischemic changes of the white matter.   Electronically Signed   By: Evangeline Dakin M.D.   On: 03/04/2013 18:25   Results for KHYLIE, SCHARA (MRN [id number]) as of 03/04/2013 19:08  Ref. Range 02/16/2013 06:31 02/17/2013 06:30 02/18/2013 06:48 02/19/2013 05:43 03/04/2013 16:56  BUN Latest Range: 6-23 mg/dL 11 12 10 8  63 (H)  Creatinine Latest Range: 0.50-1.10 mg/dL 0.75 0.83 0.80 0.71 2.32 (H)   Results for ALZINA, DONARSKI (MRN [id number]) as of 03/04/2013 19:08  Ref. Range 02/16/2013 06:31 02/17/2013 06:30 02/18/2013 06:48 02/19/2013 05:43 03/04/2013 16:56  Sodium Latest Range: 137-147 mEq/L 128 (L) 128 (L) 127 (L) 131 (L) 150 (H)    1850:  IVF bolus given for brief hypotension with improvement. Pt appears uncomfortable, moaning on stretcher; will tx for pain. APAP given for fever. Will start IV abx for HCAP. Appears clinically dehydrated; will continue judicious IVF for new hypernatremia and renal insuff. Dx and testing d/w pt's family.  Questions answered.  Verb understanding, agreeable to admit. T/C to Dr. Karie Kirks, case discussed, including:  HPI, pertinent PM/SHx, VS/PE, dx testing, ED course and treatment:  Agreeable to admit, requests he will come to the ED for eval.      Alfonzo Feller, DO 03/06/13 1512

## 2013-03-04 NOTE — ED Notes (Signed)
Patient's family that blood pressures can be taken in right arm . Stated her breast cancer was 50 years ago and that its okay to use the right arm

## 2013-03-04 NOTE — ED Notes (Signed)
Patient brought in via EMS. Alert, moaning in pain. Patient being sent from [facility] due to back pain extreme pain . Patient seen here on 12/23 after fall and diagnosed with compression fracture. Patient receiving Vicodin Q 4hrs for pain and has fentanyl patch in place. Per family patient is now "more confused" than normal.

## 2013-03-04 NOTE — Progress Notes (Signed)
ANTIBIOTIC CONSULT NOTE - INITIAL  Pharmacy Consult for Vancomycin, ABX Renal Adjustment Indication: rule out pneumonia  No Known Allergies  Patient Measurements: Height: 5' 4.17" (163 cm) Weight: 160 lb (72.576 kg) IBW/kg (Calculated) : 55.1  Vital Signs: Temp: 101.1 F (38.4 C) (01/11 1537) Temp src: Rectal (01/11 1537) BP: 111/39 mmHg (01/11 1837) Pulse Rate: 82 (01/11 1837) Intake/Output from previous day:   Intake/Output from this shift:    Labs:  Recent Labs  03/04/13 1656  WBC 18.7*  HGB 11.3*  PLT 311  CREATININE 2.32*   Estimated Creatinine Clearance: 16.4 ml/min (by C-G formula based on Cr of 2.32). No results found for this basename: Letta Median, VANCORANDOM, [place], GENTPEAK, GENTRANDOM, TOBRATROUGH, TOBRAPEAK, TOBRARND, AMIKACINPEAK, AMIKACINTROU, AMIKACIN,  in the last 72 hours   Microbiology: Recent Results (from the past 720 hour(s))  URINE CULTURE     Status: None   Collection Time    02/13/13  6:08 PM      Result Value Range Status   Specimen Description URINE, CATHETERIZED   Final   Special Requests NONE   Final   Culture  Setup Time     Final   Value: 02/13/2013 18:50     Performed at [facility]     Final   Value: >=100,000 COLONIES/ML     Performed at [facility]   Culture     Final   Value: ESCHERICHIA COLI     Performed at [facility]   Report Status 02/15/2013 FINAL   Final   Organism ID, Bacteria ESCHERICHIA COLI   Final    Medical History: Past Medical History  Diagnosis Date  . Breast cancer   . Lung mass   . Venous insufficiency   . Senile macular degeneration   . Subdural hemorrhage following injury, without mention of open intracranial wound, unspecified state of consciousness   . Unspecified kidney injury without mention of open wound into cavity   . Anxiety state, unspecified   . Hyposmolality and/or hyponatremia   . Esophageal reflux   . Hypertension   .  Acute renal failure syndrome   . Sepsis   . Dementia   . Mass of lung 06/20/2012  . Compression fracture of lumbar vertebra     L2, L4   . DDD (degenerative disc disease), lumbar     Medications:   (Not in a hospital admission) Assessment: Okay for Protocol, Acute renal failure based on previous lab results.  IV Fluid boluses ordered in ED.  Patient has order for Cefepime 1gm IV x 1 in ED as well.  Cefepime 1/11 >> Vancomycin 1/11 >>  Goal of Therapy:  Vancomycin trough level 15-20 mcg/ml  Plan:  Vancomycin 1000mg  IV x 1 tonight F/U AM labs for subsequent doses. Measure antibiotic drug levels at steady state Follow up culture results  [name] 03/04/2013,7:21 PM

## 2013-03-04 NOTE — ED Notes (Signed)
Patient transported to X-ray 

## 2013-03-04 NOTE — ED Notes (Signed)
Patient noted to have large stool burden when tylenol suppository administered. Stool soft. EDP made aware.

## 2013-03-05 ENCOUNTER — Inpatient Hospital Stay (HOSPITAL_COMMUNITY): Payer: PRIVATE HEALTH INSURANCE

## 2013-03-05 LAB — BASIC METABOLIC PANEL
BUN: 62 mg/dL — ABNORMAL HIGH (ref 6–23)
CALCIUM: 8.2 mg/dL — AB (ref 8.4–10.5)
CO2: 24 mEq/L (ref 19–32)
CREATININE: 1.92 mg/dL — AB (ref 0.50–1.10)
Chloride: 114 mEq/L — ABNORMAL HIGH (ref 96–112)
GFR, EST AFRICAN AMERICAN: 26 mL/min — AB (ref >90)
GFR, EST NON AFRICAN AMERICAN: 22 mL/min — AB (ref >90)
Glucose, Bld: 110 mg/dL — ABNORMAL HIGH (ref 70–99)
Potassium: 3.9 mEq/L (ref 3.7–5.3)
SODIUM: 152 meq/L — AB (ref 137–147)

## 2013-03-05 LAB — CBC
HCT: 30 % — ABNORMAL LOW (ref 36.0–46.0)
Hemoglobin: 10.3 g/dL — ABNORMAL LOW (ref 12.0–15.0)
MCH: 32.5 pg (ref 26.0–34.0)
MCHC: 34.3 g/dL (ref 30.0–36.0)
MCV: 94.6 fL (ref 78.0–100.0)
PLATELETS: 239 10*3/uL (ref 150–400)
RBC: 3.17 MIL/uL — ABNORMAL LOW (ref 3.87–5.11)
RDW: 14.1 % (ref 11.5–15.5)
WBC: 16.1 10*3/uL — ABNORMAL HIGH (ref 4.0–10.5)

## 2013-03-05 LAB — OCCULT BLOOD X 1 CARD TO LAB, STOOL: Fecal Occult Bld: NEGATIVE

## 2013-03-05 LAB — MRSA PCR SCREENING: MRSA by PCR: NEGATIVE

## 2013-03-05 MED ORDER — VANCOMYCIN HCL IN DEXTROSE 750-5 MG/150ML-% IV SOLN
750.0000 mg | INTRAVENOUS | Status: DC
  Administered 2013-03-05 – 2013-03-08 (×4): 750 mg via INTRAVENOUS
  Filled 2013-03-05 (×7): qty 150

## 2013-03-05 MED ORDER — LORAZEPAM 2 MG/ML IJ SOLN
1.0000 mg | Freq: Once | INTRAMUSCULAR | Status: AC
Start: 2013-03-05 — End: 2013-03-05
  Administered 2013-03-05: 1 mg via INTRAMUSCULAR
  Filled 2013-03-05: qty 1

## 2013-03-05 MED ORDER — LORAZEPAM 1 MG PO TABS: 1.0000 mg | ORAL_TABLET | Freq: Once | ORAL | Status: AC

## 2013-03-05 MED ORDER — ENOXAPARIN SODIUM 30 MG/0.3ML ~~LOC~~ SOLN
30.0000 mg | Freq: Every day | SUBCUTANEOUS | Status: DC
  Administered 2013-03-05 – 2013-03-08 (×4): 30 mg via SUBCUTANEOUS
  Filled 2013-03-05 (×4): qty 0.3

## 2013-03-05 MED ORDER — PIPERACILLIN-TAZOBACTAM IN DEX 2-0.25 GM/50ML IV SOLN
INTRAVENOUS | Status: AC
  Filled 2013-03-05: qty 100

## 2013-03-05 MED ORDER — FENTANYL 12 MCG/HR TD PT72
12.5000 ug | MEDICATED_PATCH | TRANSDERMAL | Status: DC
  Administered 2013-03-05: 12.5 ug via TRANSDERMAL
  Filled 2013-03-05: qty 1

## 2013-03-05 NOTE — Clinical Social Work Placement (Signed)
Clinical Social Work Department CLINICAL SOCIAL WORK PLACEMENT NOTE 03/05/2013  Patient:  [name], [name]  Account Number:  [id number] Admit date:  03/04/2013  Clinical Social Worker:  Benay Pike, LCSW  Date/time:  03/05/2013 03:05 PM  Clinical Social Work is seeking post-discharge placement for this patient at the following level of care:   SKILLED NURSING   (*CSW will update this form in Epic as items are completed)   03/05/2013  Patient/family provided with [facility] Department of Clinical Social Work's list of facilities offering this level of care within the geographic area requested by the patient (or if unable, by the patient's family).  03/05/2013  Patient/family informed of their freedom to choose among providers that offer the needed level of care, that participate in Medicare, Medicaid or managed care program needed by the patient, have an available bed and are willing to accept the patient.  03/05/2013  Patient/family informed of [facility]' ownership interest in [facility], as well as of the fact that they are under no obligation to receive care at this facility.  PASARR submitted to EDS on  PASARR number received from [facility] on   FL2 transmitted to all facilities in geographic area requested by pt/family on  03/05/2013 FL2 transmitted to all facilities within larger geographic area on 03/05/2013  Patient informed that his/her managed care company has contracts with or will negotiate with  certain facilities, including the following:     Patient/family informed of bed offers received:   Patient chooses bed at  Physician recommends and patient chooses bed at    Patient to be transferred to  on   Patient to be transferred to facility by   The following physician request were entered in Epic:   Additional Comments: Pt has existing pasarr number.  Benay Pike, [place]

## 2013-03-05 NOTE — Progress Notes (Signed)
Patients daughter requested that the patient have another MD during this hospitalization. Dr. Legrand Rams was notified and will be in contact with another physician. Patients family is aware. Will continue to monitor patient.

## 2013-03-05 NOTE — Progress Notes (Addendum)
ANTIBIOTIC CONSULT NOTE Pharmacy Consult for Vancomycin, Zosyn Indication: rule out pneumonia  No Known Allergies  Patient Measurements: Height: 5\' 4"  (162.6 cm) Weight: 149 lb 14.6 oz (68 kg) IBW/kg (Calculated) : 54.7  Vital Signs: Temp: 99 F (37.2 C) (01/12 0435) Temp src: Axillary (01/12 0435) BP: 125/57 mmHg (01/12 0435) Pulse Rate: 70 (01/11 2254)  Intake/Output from previous day: 01/11 0701 - 01/12 0700 In: 762.5 [I.V.:662.5; IV Piggyback:100] Out: 1250 [Urine:1250] Intake/Output from this shift:    Labs:  Recent Labs  03/04/13 1656 03/05/13 0538  WBC 18.7* 16.1*  HGB 11.3* 10.3*  PLT 311 239  CREATININE 2.32* 1.92*   Estimated Creatinine Clearance: 19.2 ml/min (by C-G formula based on Cr of 1.92). No results found for this basename: Letta Median, VANCORANDOM, [place], GENTPEAK, [place], [place], TOBRAPEAK, TOBRARND, AMIKACINPEAK, AMIKACINTROU, AMIKACIN,  in the last 72 hours   Microbiology: Recent Results (from the past 720 hour(s))  URINE CULTURE     Status: None   Collection Time    02/13/13  6:08 PM      Result Value Range Status   Specimen Description URINE, CATHETERIZED   Final   Special Requests NONE   Final   Culture  Setup Time     Final   Value: 02/13/2013 18:50     Performed at [place] [facility]     Final   Value: >=100,000 COLONIES/ML     Performed at [facility]   Culture     Final   Value: ESCHERICHIA COLI     Performed at [facility]   Report Status 02/15/2013 FINAL   Final   Organism ID, Bacteria ESCHERICHIA COLI   Final  MRSA PCR SCREENING     Status: None   Collection Time    03/04/13 10:38 PM      Result Value Range Status   MRSA by PCR NEGATIVE  NEGATIVE Final   Comment:            The GeneXpert MRSA Assay (FDA     approved for NASAL specimens     only), is one component of a     comprehensive MRSA colonization     surveillance program. It is not     intended to  diagnose MRSA     infection nor to guide or     monitor treatment for     MRSA infections.    Medical History: Past Medical History  Diagnosis Date  . Breast cancer   . Lung mass   . Venous insufficiency   . Senile macular degeneration   . Subdural hemorrhage following injury, without mention of open intracranial wound, unspecified state of consciousness   . Unspecified kidney injury without mention of open wound into cavity   . Anxiety state, unspecified   . Hyposmolality and/or hyponatremia   . Esophageal reflux   . Hypertension   . Acute renal failure syndrome   . Sepsis   . Dementia   . Mass of lung 06/20/2012  . Compression fracture of lumbar vertebra     L2, L4   . DDD (degenerative disc disease), lumbar     Medications:  Prescriptions prior to admission  Medication Sig Dispense Refill  . acetaminophen (TYLENOL) 500 MG tablet Take 500 mg by mouth every 6 (six) hours as needed for pain.      Marland Kitchen amLODipine (NORVASC) 10 MG tablet Take 10 mg by mouth daily.      Marland Kitchen beta  carotene w/minerals (OCUVITE) tablet Take 1 tablet by mouth daily.      . cholecalciferol (VITAMIN D) 1000 UNITS tablet Take 1,000 Units by mouth 2 (two) times daily.       . clonazePAM (KLONOPIN) 0.5 MG tablet Take 0.25 mg by mouth daily.      . enalapril (VASOTEC) 20 MG tablet Take 40 mg by mouth daily.       . fentaNYL (DURAGESIC - DOSED MCG/HR) 50 MCG/HR Place 1 patch (50 mcg total) onto the skin every 3 (three) days.  5 patch  0  . furosemide (LASIX) 40 MG tablet Take 50 mg by mouth 2 (two) times daily.       Marland Kitchen HYDROcodone-acetaminophen (NORCO/VICODIN) 5-325 MG per tablet Take 1-2 tablets by mouth every 4 (four) hours as needed for moderate pain.  30 tablet  0  . iron polysaccharides (POLY-IRON 150) 150 MG capsule Take 150 mg by mouth daily.      Marland Kitchen senna (SENOKOT) 8.6 MG tablet Take 1 tablet by mouth 2 (two) times daily.       Assessment: 80 yo F admitted from NH with AMS, fever.  She was started on  empiric, broad-spectrum antibiotics for possible PNA.  CXR negative.   She was also recently hospitalized on 12/23 & treated for [facility] UTI.   She has acute renal failure.  Scr is trending down overnight.    Cefepime 1/11 >>1/11 Vancomycin 1/11 >> Zosyn 1/11>>  Goal of Therapy:  Vancomycin trough level 15-20 mcg/ml  Plan:  Continue Zosyn 2.25gm IV q8h until CrCl>39ml/min Vancomycin 750mg  IV q24h Check Vancomycin trough at steady state Lovenox 30mg  sq daily for CrCl<76ml/min Monitor renal function and cx data   Biagio Borg 03/05/2013,8:45 AM

## 2013-03-05 NOTE — Progress Notes (Signed)
Dr Karie Kirks has agreed to take over care of patient.

## 2013-03-05 NOTE — H&P (Signed)
NAMEAMERIS, [name]             ACCOUNT NO.:  [id number]  MEDICAL RECORD NO.:  [id number]  LOCATION:  G315                          FACILITY:  [facility]  PHYSICIAN:  Estill Bamberg. Karie Kirks, M.D.DATE OF BIRTH:  06/14/1922  DATE OF ADMISSION:  03/04/2013 DATE OF DISCHARGE:  LH                             HISTORY & PHYSICAL   HISTORY OF PRESENT ILLNESS:  This 80 year old woman was brought from [place] to the [facility] Emergency Room for evaluation.  I talked to her daughter and granddaughter and also reviewed the nursing notes.  Apparently, for the last few days, she has been in more pain and more confused.  On February 07, 2013, she was admitted to this hospital with a new L2 compression fracture and an old L4 compression fracture.  She was discharged to [facility] on fentanyl 50 mcg patch every 3 days and also given hydrocodone/APAP 5/325 one to two tablets every 4 hours for moderate-to-severe pain.  She has been on the patch now for almost 3 weeks.  Apparently, she developed hyponatremia and ended up at this hospital about 4 years ago.  She was transferred to the [facility] and within 3 months she able to go home.  She ended up with another hospitalization due to, I believe, hyponatremia and at this time ended up at the [facility] in [place].  She did not have physical therapy and for a year did not walk and has remained without walking since then.  More recently she has been back to [facility].  She had a subdural hematoma, which may have contributed to some of this.  She had a breast cancer many years ago.  She has had venous insufficiency, senile macular degeneration, chronic anxiety, GI reflux, hypertension, dementia, and osteopenia secondary to degenerative disk disease.  MEDICATIONS:  She has been on at [facility] include acetaminophen, amlodipine, beta-carotene with minerals, cholecalciferol, clonazepam, enalapril,  fentanyl patch, furosemide, hydrocodone/APAP, iron polysaccharides, and senna.  I examined her in the emergency room.  She was moaning lying on the exam table.  She was well developed and well nourished.  Her daughter and granddaughter were present.  It was hard to get history from her.  She really could not tell me where she was hurting, but it seemed to be somewhere in the lower back.  PHYSICAL EXAMINATION:  VITAL SIGNS:  Her temperature is 99.9 initially, but also went up to 101.1 while in the emergency room.  Pulse was 83, respiratory rate 20, the blood pressure 114/50. HEART:  Had a regular rhythm, but heart sounds were faint. LUNGS:  Appeared to be clear throughpout. ABDOMEN:  Soft. EXTREMITIES:  Appeared to be grossly normal.  LABORATORY STUDY:  Her admission white cell count was 18,700 of which 88% were neutrophils.  Her hemoglobin was 11.3.  Her D-dimer was 1.87. Chemistry showed a sodium of 150, BUN 63, creatinine 2.32.  Albumin was 3.0.  AST 53.  The UA showed 3-6 wbc's, 0-2 rbc's per HPF with rare bacteria.  DIAGNOSTIC STUDY:  CT scan of the head without contrast showed severe cortical and deep atrophy and mild cerebellar atrophy as well with white matter.  Chest x-ray showed suspected left-sided rib fractures.  She also may have had a pleural effusion.  There was no pneumothorax.  X-ray of the lumbar spine showed a stable mild superior endplate compression fracture at L2, and a chronic mild-to-moderate superior endplate compression deformity at L4.  There was also mild superior endplate changes involving T11 through L1, but no loss of height.  There was mild multilevel degenerative changes.  Low vascular calcifications.  There was no real interval change from her most recent LS spine.  An EKG showed sinus tachycardia with a rate of 101, ST and T-wave abnormalities in inferior leads.  ADMISSION DIAGNOSES: 1. Severe pain in the low back region possibly secondary to  lumbar     compression fractures. 2. Change in mental status, which may be due to the narcotics,     hydrocodone/APAP and fentanyl patch. 3. Dehydration. 4. Dementia. 5. Benign essential hypertension. 6. Multiple vertebral endplate compression fractures with     osteoporosis. 7. Status post breast cancer many years ago. 8. Chronic venous insufficiency. 9. Renal changes (at this point, her BUN is 63 and the     creatinine 2.32; but earlier, several weeks ago, the BUN was as low     as 11 and the creatinine 0.75).  PLAN:  I talked to the daughters.  I reviewed her x-rays.  I think a hip x-ray is in order as well and hopefully will get it done tonight.  Her blood gas is also pending.  Because of her fever and elevated white cell count she may have a pneumonia not seen on chest x-ray, so I am repeating a chest x- ray tomorrow as well as a CBC and a BMP.  She will be started on Zosyn and vancomycin because of her long term stays in the nursing home.    Estill Bamberg. Karie Kirks, M.D.    SDK/MEDQ  D:  03/04/2013  T:  03/05/2013  Job:  [id number]

## 2013-03-05 NOTE — Clinical Social Work Psychosocial (Signed)
Clinical Social Work Department BRIEF PSYCHOSOCIAL ASSESSMENT 03/05/2013  Patient:  [name], [name]     Account Number:  [id number]     Admit date:  03/04/2013  Clinical Social Worker:  Wyatt Haste  Date/Time:  03/05/2013 03:00 PM  Referred by:  Physician  Date Referred:  03/05/2013 Referred for  SNF Placement   Other Referral:   Interview type:  Family Other interview type:   [name]- daughter    PSYCHOSOCIAL DATA Living Status:  FACILITY Admitted from facility:  Central Level of care:  [place] Primary support name:  [name] Primary support relationship to patient:  CHILD, ADULT Degree of support available:   supportive    CURRENT CONCERNS Current Concerns  Post-Acute Placement   Other Concerns:    SOCIAL WORK ASSESSMENT / PLAN CSW met with pt's daughter, [name] at bedside. Pt not oriented. [name] reports pt has been a resident at [facility] for about 2 years. Pt is known to CSW from previous admissions. She recently returned to [facility] at the end of December. [name] expresses concerns about care at [facility] and has not been pleased with staff. She was pleased with care at one point. She would like to transfer pt to [facility] if possible in order to have pt closer to where she lives in [place]. Pt has been to several other facilities in the past and daughter has not been happy with any of them. She requests [facility] and [facility] if possible. [name] is aware that pt would have to transfer Medicaid to [place]. SNF list provided. [name] reports feelings of frustration that a facility has not been able to provide what she feels is appropriate care. Misty, Scientist, physiological at [facility], reports pt is total care and okay to return if needed.   Assessment/plan status:  Psychosocial Support/Ongoing Assessment of Needs Other assessment/ plan:   Information/referral to community resources:   SNF list    PATIENT'S/FAMILY'S RESPONSE TO PLAN OF CARE: Pt unable to  discuss plan of care. CSW will initiate new bed search in [place] and follow up with daughter.       [name], [place]

## 2013-03-06 LAB — CBC WITH DIFFERENTIAL/PLATELET
BASOS PCT: 0 % (ref 0–1)
Basophils Absolute: 0 10*3/uL (ref 0.0–0.1)
Basophils Absolute: 0 10*3/uL (ref 0.0–0.1)
Basophils Relative: 0 % (ref 0–1)
EOS ABS: 0 10*3/uL (ref 0.0–0.7)
EOS ABS: 0 10*3/uL (ref 0.0–0.7)
Eosinophils Relative: 0 % (ref 0–5)
Eosinophils Relative: 0 % (ref 0–5)
HCT: 29.5 % — ABNORMAL LOW (ref 36.0–46.0)
HCT: 29.6 % — ABNORMAL LOW (ref 36.0–46.0)
Hemoglobin: 9.9 g/dL — ABNORMAL LOW (ref 12.0–15.0)
Hemoglobin: 10 g/dL — ABNORMAL LOW (ref 12.0–15.0)
LYMPHS ABS: 1.1 10*3/uL (ref 0.7–4.0)
Lymphocytes Relative: 8 % — ABNORMAL LOW (ref 12–46)
Lymphocytes Relative: 6 % — ABNORMAL LOW (ref 12–46)
Lymphs Abs: 0.8 10*3/uL (ref 0.7–4.0)
MCH: 31.8 pg (ref 26.0–34.0)
MCH: 31.9 pg (ref 26.0–34.0)
MCHC: 33.6 g/dL (ref 30.0–36.0)
MCHC: 33.8 g/dL (ref 30.0–36.0)
MCV: 94.9 fL (ref 78.0–100.0)
MCV: 94.6 fL (ref 78.0–100.0)
MONO ABS: 0.5 10*3/uL (ref 0.1–1.0)
MONOS PCT: 6 % (ref 3–12)
Monocytes Absolute: 0.8 10*3/uL (ref 0.1–1.0)
Monocytes Relative: 4 % (ref 3–12)
NEUTROS PCT: 90 % — AB (ref 43–77)
Neutro Abs: 11.7 10*3/uL — ABNORMAL HIGH (ref 1.7–7.7)
Neutro Abs: 12.3 10*3/uL — ABNORMAL HIGH (ref 1.7–7.7)
Neutrophils Relative %: 86 % — ABNORMAL HIGH (ref 43–77)
Platelets: 253 10*3/uL (ref 150–400)
Platelets: 268 10*3/uL (ref 150–400)
RBC: 3.11 MIL/uL — AB (ref 3.87–5.11)
RBC: 3.13 MIL/uL — ABNORMAL LOW (ref 3.87–5.11)
RDW: 14.2 % (ref 11.5–15.5)
RDW: 14.2 % (ref 11.5–15.5)
WBC: 13.6 10*3/uL — ABNORMAL HIGH (ref 4.0–10.5)
WBC: 13.6 10*3/uL — ABNORMAL HIGH (ref 4.0–10.5)

## 2013-03-06 LAB — BASIC METABOLIC PANEL
BUN: 57 mg/dL — ABNORMAL HIGH (ref 6–23)
CALCIUM: 8.3 mg/dL — AB (ref 8.4–10.5)
CO2: 25 meq/L (ref 19–32)
Chloride: 115 mEq/L — ABNORMAL HIGH (ref 96–112)
Creatinine, Ser: 1.58 mg/dL — ABNORMAL HIGH (ref 0.50–1.10)
GFR calc Af Amer: 33 mL/min — ABNORMAL LOW (ref >90)
GFR calc non Af Amer: 28 mL/min — ABNORMAL LOW (ref >90)
Glucose, Bld: 114 mg/dL — ABNORMAL HIGH (ref 70–99)
POTASSIUM: 3.7 meq/L (ref 3.7–5.3)
SODIUM: 153 meq/L — AB (ref 137–147)

## 2013-03-06 LAB — PRO B NATRIURETIC PEPTIDE: Pro B Natriuretic peptide (BNP): 880.5 pg/mL — ABNORMAL HIGH (ref 0–450)

## 2013-03-06 MED ORDER — DEXTROSE-NACL 5-0.45 % IV SOLN
INTRAVENOUS | Status: DC
  Administered 2013-03-06: 12:00:00 via INTRAVENOUS

## 2013-03-06 MED ORDER — PIPERACILLIN-TAZOBACTAM 3.375 G IVPB
3.3750 g | Freq: Three times a day (TID) | INTRAVENOUS | Status: DC
  Administered 2013-03-06 – 2013-03-09 (×10): 3.375 g via INTRAVENOUS
  Filled 2013-03-06 (×19): qty 50

## 2013-03-06 NOTE — Clinical Documentation Improvement (Signed)
THIS DOCUMENT IS NOT A PERMANENT PART OF THE MEDICAL RECORD  Please update your documentation with the medical record to reflect your response to this query. If you need help knowing how to do this please call [phone].  03/06/13  Dear Dr. Karie Kirks a review of the patient medical record has revealed the following indicators.    Based on your clinical judgment, please clarify and document in a progress note and/or discharge summary the clinical condition associated with the following supporting information:   Possible Clinical Conditions?  Acute Renal Failure Acute Kidney Injury Acute Tubular Necrosis Acute on Chronic Renal Failure Chronic Renal Failure Other Condition_____________ Cannot Clinically Determine     Supporting Information:  Risk Factors: Advanced age: 80 Renal changes (at this point, her BUN is 63 and the  creatinine 2.32; but earlier, several weeks ago, the BUN was as low  as 11 and the creatinine 0.75).  Diagnostics:  Lab: BUN/CR/GFR 02/19/13 = 8/0.71/75 03/04/13 = 63/2.32/18 03/05/13 = 62/1.92/22 03/06/13 = 57/1.58/28   Treatments:  Daily BMP  IV D5 1/2NS @50ml /h               You may use possible, probable, or suspect with inpatient documentation. possible, probable, suspected diagnoses MUST be documented at the time of discharge  Reviewed: Answered by Dr. Anastasio Champion Thank You,  [place] Documentation Specialist: [place]

## 2013-03-06 NOTE — Progress Notes (Signed)
ANTIBIOTIC CONSULT NOTE  Pharmacy Consult for Vancomycin, Zosyn Indication: rule out pneumonia  No Known Allergies  Patient Measurements: Height: 5\' 4"  (162.6 cm) Weight: 156 lb 12 oz (71.1 kg) IBW/kg (Calculated) : 54.7  Vital Signs: Temp: 99.1 F (37.3 C) (01/13 0600) Temp src: Oral (01/13 0600) BP: 148/63 mmHg (01/13 0600) Pulse Rate: 79 (01/13 0600)  Intake/Output from previous day: 01/12 0701 - 01/13 0700 In: 2190 [P.O.:90; I.V.:1800; IV Piggyback:300] Out: 1405 [Urine:1400; Stool:5] Intake/Output from this shift:    Labs:  Recent Labs  03/04/13 1656 03/05/13 0538 03/06/13 0541  WBC 18.7* 16.1* 13.6*  HGB 11.3* 10.3* 9.9*  PLT 311 239 253  CREATININE 2.32* 1.92* 1.58*   Estimated Creatinine Clearance: 23.8 ml/min (by C-G formula based on Cr of 1.58). No results found for this basename: [name], VANCORANDOM, [name], GENTPEAK, [name], [name], TOBRAPEAK, TOBRARND, AMIKACINPEAK, AMIKACINTROU, AMIKACIN,  in the last 72 hours   Microbiology: Recent Results (from the past 720 hour(s))  URINE CULTURE     Status: None   Collection Time    02/13/13  6:08 PM      Result Value Range Status   Specimen Description URINE, CATHETERIZED   Final   Special Requests NONE   Final   Culture  Setup Time     Final   Value: 02/13/2013 18:50     Performed at [name]     Final   Value: >=100,000 COLONIES/ML     Performed at [name]   Culture     Final   Value: ESCHERICHIA COLI     Performed at [name]   Report Status 02/15/2013 FINAL   Final   Organism ID, Bacteria ESCHERICHIA COLI   Final  MRSA PCR SCREENING     Status: None   Collection Time    03/04/13 10:38 PM      Result Value Range Status   MRSA by PCR NEGATIVE  NEGATIVE Final   Comment:            The GeneXpert MRSA Assay (FDA     approved for NASAL specimens     only), is one component of [facility]     comprehensive MRSA colonization   surveillance program. It is not     intended to diagnose MRSA     infection nor to guide or     monitor treatment for     MRSA infections.    Medical History: Past Medical History  Diagnosis Date  . Breast cancer   . Lung mass   . Venous insufficiency   . Senile macular degeneration   . Subdural hemorrhage following injury, without mention of open intracranial wound, unspecified state of consciousness   . Unspecified kidney injury without mention of open wound into cavity   . Anxiety state, unspecified   . Hyposmolality and/or hyponatremia   . Esophageal reflux   . Hypertension   . Acute renal failure syndrome   . Sepsis   . Dementia   . Mass of lung 06/20/2012  . Compression fracture of lumbar vertebra     L2, L4   . DDD (degenerative disc disease), lumbar     Medications:  Prescriptions prior to admission  Medication Sig Dispense Refill  . acetaminophen (TYLENOL) 500 MG tablet Take 500 mg by mouth every 6 (six) hours as needed for pain.      Marland Kitchen amLODipine (NORVASC) 10 MG tablet Take 10 mg by mouth daily.      Marland Kitchen  beta carotene w/minerals (OCUVITE) tablet Take 1 tablet by mouth daily.      . cholecalciferol (VITAMIN D) 1000 UNITS tablet Take 1,000 Units by mouth 2 (two) times daily.       . clonazePAM (KLONOPIN) 0.5 MG tablet Take 0.25 mg by mouth daily.      . enalapril (VASOTEC) 20 MG tablet Take 40 mg by mouth daily.       . fentaNYL (DURAGESIC - DOSED MCG/HR) 50 MCG/HR Place 1 patch (50 mcg total) onto the skin every 3 (three) days.  5 patch  0  . furosemide (LASIX) 40 MG tablet Take 50 mg by mouth 2 (two) times daily.       Marland Kitchen HYDROcodone-acetaminophen (NORCO/VICODIN) 5-325 MG per tablet Take 1-2 tablets by mouth every 4 (four) hours as needed for moderate pain.  30 tablet  0  . iron polysaccharides (POLY-IRON 150) 150 MG capsule Take 150 mg by mouth daily.      Marland Kitchen senna (SENOKOT) 8.6 MG tablet Take 1 tablet by mouth 2 (two) times daily.       Assessment: 80 yo F admitted  from NH with AMS, fever.  She was started on empiric, broad-spectrum antibiotics for possible PNA.  CXR negative.  She was also recently hospitalized on 12/23 & treated for [name] UTI.  She has acute renal failure.  Scr is improving.  Estimated Creatinine Clearance: 23.8 ml/min (by C-G formula based on Cr of 1.58).  Cefepime 1/11 >>1/11 Vancomycin 1/11 >> Zosyn 1/11>>  Goal of Therapy:  Vancomycin trough level 15-20 mcg/ml  Plan:  Zosyn 3.375gm IV q8h, each dose over 4 hrs Vancomycin 750mg  IV q24h Check Vancomycin trough at steady state Lovenox 30mg  sq daily for CrCl<35ml/min Monitor renal function and cx data   [name] 03/06/2013,10:28 AM

## 2013-03-06 NOTE — Progress Notes (Signed)
Utilization Review Complete  

## 2013-03-07 ENCOUNTER — Inpatient Hospital Stay (HOSPITAL_COMMUNITY): Payer: PRIVATE HEALTH INSURANCE

## 2013-03-07 DIAGNOSIS — I517 Cardiomegaly: Secondary | ICD-10-CM

## 2013-03-07 LAB — CBC
HEMATOCRIT: 29.7 % — AB (ref 36.0–46.0)
HEMOGLOBIN: 9.9 g/dL — AB (ref 12.0–15.0)
MCH: 31.6 pg (ref 26.0–34.0)
MCHC: 33.3 g/dL (ref 30.0–36.0)
MCV: 94.9 fL (ref 78.0–100.0)
Platelets: 242 10*3/uL (ref 150–400)
RBC: 3.13 MIL/uL — ABNORMAL LOW (ref 3.87–5.11)
RDW: 14.1 % (ref 11.5–15.5)
WBC: 12.1 10*3/uL — ABNORMAL HIGH (ref 4.0–10.5)

## 2013-03-07 LAB — BASIC METABOLIC PANEL
BUN: 41 mg/dL — AB (ref 6–23)
CALCIUM: 8.3 mg/dL — AB (ref 8.4–10.5)
CO2: 26 mEq/L (ref 19–32)
CREATININE: 1.3 mg/dL — AB (ref 0.50–1.10)
Chloride: 115 mEq/L — ABNORMAL HIGH (ref 96–112)
GFR calc Af Amer: 41 mL/min — ABNORMAL LOW (ref >90)
GFR, EST NON AFRICAN AMERICAN: 36 mL/min — AB (ref >90)
GLUCOSE: 118 mg/dL — AB (ref 70–99)
Potassium: 3.5 mEq/L — ABNORMAL LOW (ref 3.7–5.3)
Sodium: 154 mEq/L — ABNORMAL HIGH (ref 137–147)

## 2013-03-07 MED ORDER — POTASSIUM CHLORIDE 2 MEQ/ML IV SOLN
INTRAVENOUS | Status: DC
  Administered 2013-03-07 – 2013-03-09 (×2): via INTRAVENOUS
  Filled 2013-03-07 (×7): qty 1000

## 2013-03-07 NOTE — Progress Notes (Addendum)
[name] MWN:[id number] DOB: [birth year] DOA: 03/04/2013 PCP: Rosita Fire, MD   Subjective: This lady remains confused. According to nursing staff, she complains of pain when she is moved. She has a thoracic compression fracture. She is currently on a fentanyl patch and hydrocodone when necessary orally.           Physical Exam: Blood pressure 125/70, pulse 72, temperature 98.3 F (36.8 C), temperature source Oral, resp. rate 20, height 5\' 4"  (1.626 m), weight 71.1 kg (156 lb 12 oz), SpO2 99.00%. She looks chronically sick. She is in a fetal position. She appears still somewhat clinically dehydrated. Heart sounds are present with no evidence of heart failure clinically. There are no murmurs. Lung fields anteriorly are clear. She is unable to sit up for me to examine her lung fields posteriorly. There is no peripheral pitting edema in her legs.   Investigations:  Recent Results (from the past 240 hour(s))  MRSA PCR SCREENING     Status: None   Collection Time    03/04/13 10:38 PM      Result Value Range Status   MRSA by PCR NEGATIVE  NEGATIVE Final   Comment:            The GeneXpert MRSA Assay (FDA     approved for NASAL specimens     only), is one component of a     comprehensive MRSA colonization     surveillance program. It is not     intended to diagnose MRSA     infection nor to guide or     monitor treatment for     MRSA infections.     Basic Metabolic Panel:  Recent Labs  03/06/13 0541 03/07/13 0532  NA 153* 154*  K 3.7 3.5*  CL 115* 115*  CO2 25 26  GLUCOSE 114* 118*  BUN 57* 41*  CREATININE 1.58* 1.30*  CALCIUM 8.3* 8.3*   Liver Function Tests:  Recent Labs  03/04/13 1656  AST 53*  ALT 21  ALKPHOS 30*  BILITOT 0.6  PROT 7.1  ALBUMIN 3.0*     CBC:  Recent Labs  03/06/13 0541 03/06/13 1945 03/07/13 0532  WBC 13.6* 13.6* 12.1*  NEUTROABS 11.7* 12.3*  --   HGB 9.9* 10.0* 9.9*  HCT 29.5* 29.6* 29.7*  MCV 94.9 94.6 94.9   PLT 253 268 242    No results found.    Medications: I have reviewed the patient's current medications.  Impression: 1. Pneumonia. 2. Dehydration/acute renal failure, improving. 3. Hypernatremia secondary to #2, slowly improving but sodium is still elevated despite patient being on half normal saline. 4. Dementia. 5. Altered mental status secondary to combination of #1, #2, #3 and #4. 6. Hypokalemia.     Plan: 1. Change intravenous fluids to D5 with potassium added. 2. Discontinue fentanyl patch and only use oral hydrocodone as required. 3. Repeat chest x-ray. 4. Discontinue ACE inhibitor in view of renal failure. 5. Echocardiogram as there was a question of fluid overload on chest x-ray report. Clinically I do not think she is in heart failure.  Consultants:  None.   Procedures:  None.   Antibiotics:  Intravenous vancomycin and intravenous Zosyn.                   Code Status: Full code for the time being  Family Communication: Will try to reach family members today or tomorrow to discuss further.   Disposition Plan: Skilled nursing facility when medically stable.  Time  spent: 20 minutes.   LOS: 3 days   Doree Albee Pager [phone]  03/07/2013, 8:44 AM

## 2013-03-07 NOTE — Clinical Social Work Note (Signed)
CSW spoke with 2 facilities in [place] that pt's daughter, Abigail Butts was requesting. [facility] would like to discuss insurance with Abigail Butts. CSW called her to see if she was interested as facility is hopeful they can take pt. Abigail Butts aware that [place] [facility] notified [facility] that pt would not be returning because Abigail Butts said under no circumstance will pt ever go back there. She was frustrated that this was done. CSW attempted to explain situation, but daughter hung up. Abigail Butts states she will contact [facility] on her own.  Benay Pike, [place]

## 2013-03-07 NOTE — Progress Notes (Signed)
[name], [name]             ACCOUNT NO.:  [id number]  MEDICAL RECORD NO.:  [id number]  LOCATION:  D428                          FACILITY:  [facility]  PHYSICIAN:  Estill Bamberg. Karie Kirks, M.D.DATE OF BIRTH:  1922-06-26  DATE OF PROCEDURE: DATE OF DISCHARGE:                                PROGRESS NOTE   SUBJECTIVE:  The patient says she does not know why she is not feeling better.  She tells me she has been eating so much.  I reminded her that when she came in, she was approaching comatose just 2 nights ago.  OBJECTIVE:  VITAL SIGNS:  Temperature is 99.1  but was up to 100.9 last night, pulse is 79, respiratory rate 22, and her blood pressure 148/63. GENERAL:  She is still curled up in bed.  Her color is good and she is off the Ventimask.  She appears to be much more alert.  She is able to speak in full sentences.  This is all much improved compared to 2 nights ago. LUNGS:  Show decreased breath sounds throughout, but no rhonchi or wheezes. HEART:  Regular rhythm and a rate of 80.  LABORATORY DATA:  Her white cell count today is down to 13,600. Hemoglobin has also dropped to 9.9.  She has 86% neutrophils.  Her sodium is 153, chloride 115, BUN 57, creatinine 1.58, much improved. Her BNP yesterday was 880.  Her chest x-ray showed question of fluid overload, but underlying pneumonia in the left lower lobe was not excluded.  ASSESSMENT: 1. Pneumonia. 2. Low back pain, improved. 3. Change in mental status, improved. 4. Dehydration, cleared. 5. Dementia. 6. Hypernatremia.  PLAN:  She will continue with her IV antibiotics.  I have switched her IV fluids from normal saline at 75 mL an hour to D5 half-normal saline at 50 mL an hour.  I talked to discharge planning.  Hopefully, she will be ready to be discharged home within the next several days.  I accepted this patient from Dr. Legrand Rams yesterday at the request of Dr. Legrand Rams and her family.     Estill Bamberg. Karie Kirks,  M.D.     SDK/MEDQ  D:  03/06/2013  T:  03/06/2013  Job:  [id number]

## 2013-03-07 NOTE — Progress Notes (Signed)
*  PRELIMINARY RESULTS* Echocardiogram 2D Echocardiogram has been performed.  [place], [place] 03/07/2013, 4:20 PM

## 2013-03-08 ENCOUNTER — Inpatient Hospital Stay (HOSPITAL_COMMUNITY): Payer: PRIVATE HEALTH INSURANCE

## 2013-03-08 LAB — COMPREHENSIVE METABOLIC PANEL
ALBUMIN: 2 g/dL — AB (ref 3.5–5.2)
ALT: 21 U/L (ref 0–35)
AST: 24 U/L (ref 0–37)
Alkaline Phosphatase: 34 U/L — ABNORMAL LOW (ref 39–117)
BUN: 31 mg/dL — ABNORMAL HIGH (ref 6–23)
CALCIUM: 8 mg/dL — AB (ref 8.4–10.5)
CO2: 25 mEq/L (ref 19–32)
CREATININE: 1.21 mg/dL — AB (ref 0.50–1.10)
Chloride: 106 mEq/L (ref 96–112)
GFR calc Af Amer: 45 mL/min — ABNORMAL LOW (ref >90)
GFR calc non Af Amer: 39 mL/min — ABNORMAL LOW (ref >90)
Glucose, Bld: 104 mg/dL — ABNORMAL HIGH (ref 70–99)
Potassium: 3.8 mEq/L (ref 3.7–5.3)
Sodium: 144 mEq/L (ref 137–147)
TOTAL PROTEIN: 5.9 g/dL — AB (ref 6.0–8.3)
Total Bilirubin: 0.6 mg/dL (ref 0.3–1.2)

## 2013-03-08 LAB — CBC
HCT: 27.4 % — ABNORMAL LOW (ref 36.0–46.0)
Hemoglobin: 9.3 g/dL — ABNORMAL LOW (ref 12.0–15.0)
MCH: 32.1 pg (ref 26.0–34.0)
MCHC: 33.9 g/dL (ref 30.0–36.0)
MCV: 94.5 fL (ref 78.0–100.0)
PLATELETS: 220 10*3/uL (ref 150–400)
RBC: 2.9 MIL/uL — ABNORMAL LOW (ref 3.87–5.11)
RDW: 13.9 % (ref 11.5–15.5)
WBC: 9.9 10*3/uL (ref 4.0–10.5)

## 2013-03-08 MED ORDER — ENOXAPARIN SODIUM 40 MG/0.4ML ~~LOC~~ SOLN
40.0000 mg | Freq: Every day | SUBCUTANEOUS | Status: DC
  Administered 2013-03-09: 40 mg via SUBCUTANEOUS
  Filled 2013-03-08: qty 0.4

## 2013-03-08 NOTE — Clinical Social Work Note (Signed)
CSW met with daughter today at bedside. She states that she will not go to see facility in [place] until tomorrow morning. CSW encouraged her to at least call facility and see what can be started today. She states that if [facility] does not work out, then she requests her mother to return to [facility]. She is aware that waiting another day to discuss transfer with SNF in [place] is limiting opportunity. Confirmed with Debbie at [facility] okay for return. CSW realized after conversation that facility interested is actually [facility], not [facility]. Called daughter and left voicemail with this information and also requested her to call CSW back to verify received. Daughter was open to either [facility] or [facility]. Daughter requesting to speak with MD. CSW called MD and provided daughter's contact information. CSW will follow up.  Benay Pike, [place]

## 2013-03-08 NOTE — Progress Notes (Addendum)
RUMOR SUN YQM:[id number] DOB: 08-12-1922 DOA: 03/04/2013 PCP: Rosita Fire, MD   Subjective: This lady is somewhat more alert. She is able to respond to my questions better than she did yesterday. She says she is in pain but does not obviously look like she is in pain.           Physical Exam: Blood pressure 112/79, pulse 75, temperature 98.4 F (36.9 C), temperature source Axillary, resp. rate 20, height 5\' 4"  (1.626 m), weight 71.1 kg (156 lb 12 oz), SpO2 98.00%. She looks better hydrated today. She is nontoxic or septic clinically. Heart sounds are present without murmurs. Lung fields are clear anteriorly. She continues to lie in a fetal position.   Investigations:  Recent Results (from the past 240 hour(s))  MRSA PCR SCREENING     Status: None   Collection Time    03/04/13 10:38 PM      Result Value Range Status   MRSA by PCR NEGATIVE  NEGATIVE Final   Comment:            The GeneXpert MRSA Assay (FDA     approved for NASAL specimens     only), is one component of a     comprehensive MRSA colonization     surveillance program. It is not     intended to diagnose MRSA     infection nor to guide or     monitor treatment for     MRSA infections.     Basic Metabolic Panel:  Recent Labs  03/07/13 0532 03/08/13 0523  NA 154* 144  K 3.5* 3.8  CL 115* 106  CO2 26 25  GLUCOSE 118* 104*  BUN 41* 31*  CREATININE 1.30* 1.21*  CALCIUM 8.3* 8.0*   Liver Function Tests:  Recent Labs  03/08/13 0523  AST 24  ALT 21  ALKPHOS 34*  BILITOT 0.6  PROT 5.9*  ALBUMIN 2.0*     CBC:  Recent Labs  03/06/13 0541 03/06/13 1945 03/07/13 0532 03/08/13 0523  WBC 13.6* 13.6* 12.1* 9.9  NEUTROABS 11.7* 12.3*  --   --   HGB 9.9* 10.0* 9.9* 9.3*  HCT 29.5* 29.6* 29.7* 27.4*  MCV 94.9 94.6 94.9 94.5  PLT 253 268 242 220    Dg Chest Port 1 View  03/07/2013   CLINICAL DATA:  Pneumonia.  Shortness of breath.  EXAM: PORTABLE CHEST - 1 VIEW  COMPARISON:   03/05/2013.  FINDINGS: Interim parts clearing of bilateral pulmonary alveolar infiltrates noted suggesting clearing congestive heart failure. Persistent atelectasis and/or pneumonia noted in the lung bases. Stable cardiomegaly. Eventration right hemidiaphragm. No pneumothorax or acute osseous abnormality.  IMPRESSION: 1. Partial clearing of congestive heart failure pulmonary edema appear. 2. Bibasilar atelectasis and/or pneumonia persists.   Electronically Signed   By: Marcello Moores  Register   On: 03/07/2013 09:22      Medications: I have reviewed the patient's current medications.  Impression: 1. Pneumonia, clinically improving. 2. Dehydration/acute renal failure, improving. 3. Hypernatremia secondary to #2, improving with D5 and potassium. 4. Dementia. 5. Altered mental status secondary to the above, slowly improving.     Plan: 1. Reduce IV fluid rate to 50 cc an hour. Encourage oral intake. Echocardiogram did not show any significant systolic dysfunction in those only grade 1 diastolic dysfunction. 2. Continue with intravenous antibiotics for another day or so and then convert to oral. 3. Start discharge planning.  Consultants:  None.   Procedures:  Echocardiogram: Study Conclusions  - Left ventricle:  The cavity size was normal. Wall thickness was increased in a pattern of mild LVH. Systolic function was normal. The estimated ejection fraction was in the range of 60% to 65%. Cannot exclude hypokinesis of the basalinferior myocardium. Doppler parameters are consistent with abnormal left ventricular relaxation (grade 1 diastolic dysfunction). - Aortic valve: Mildly calcified annulus. Trileaflet. No significant regurgitation. Mean gradient: 40mm Hg (S). - Mitral valve: Calcified annulus. Trivial regurgitation. - Left atrium: The atrium was mildly dilated. - Right atrium: Central venous pressure: 60mm Hg (est). - Atrial septum: No defect or patent foramen ovale was identified. -  Tricuspid valve: Trivial regurgitation. - Pulmonary arteries: PA peak pressure: 9mm Hg (S). - Pericardium, extracardiac: A prominent pericardial fat pad was present. There was no pericardial effusion. Impressions:  - Mild LVH with LVEF 60-65%, cannot exclude basal inferior hypokinesis, grade 1 diastolic dysfunction. MAC with trivial mitral regurgitation. Mild left atrial enlargement. Trivial tricuspid regurgitation with PASP 25 mmHg. Transthoracic echocardiography. M-mode, complete 2D, spectral Doppler, and color Doppler. Height: Height: 162.6cm. Height: 64in. Weight: Weight: 71.1kg. Weight: 156.4lb. Body mass index: BMI: 26.9kg/m^2. Body surface area: BSA: 1.75m^2. Blood pressure: 125/70. Patient status: Inpatient. Location: Bedside.   Antibiotics:  Intravenous vancomycin and intravenous Zosyn.                   Code Status: Currently full code.    Disposition Plan: Skilled nursing facility when medically stable.  Time spent: 20 minutes.   LOS: 4 days   Doree Albee Pager [phone]  03/08/2013, 8:29 AM

## 2013-03-08 NOTE — Evaluation (Signed)
Physical Therapy Evaluation Patient Details Name: [name] MRN: [id number] DOB: 04-11-1922 Today's Date: 03/08/2013 Time: 3532-9924 PT Time Calculation (min): 76 min  PT Assessment / Plan / Recommendation History of Present Illness  Pt was admitted with dehydration.  She is a resident of [facility] and has been bedbound for the past 3 weeks due to a compression fx of the lumbar spine (L2 on 02-07-13).  Prior to that, pt was w/c dependent at [facility].  Daughter is concerned that pt has not been getting up to a chair at [facility]...they have told her that they could not do this without a brace on her.  While bedbound, pt has lost the ability to feed herself.  Clinical Impression  Pt was seen for evaluation.  Her severe LE flexion contractures will minimize any functional rehab potential.  She is requiring a full lift to transfer bed to chair.  Pt was cooperative with this and was able to tolerate sitting with no difficulty.  At this point, I do not think a CASH brace would be warranted nor do I think she could tolerate this.  My guess is that the staff at [facility] have been reluctant to get her OOB due to her compression fx without this brace.  OT might be helpful in facilitating self feeding of pt.  Once she is sitting more, she will probably become much more able to self feed.  This should help prevent further dehydration.  In my opinion, no further PT is indicated.    PT Assessment  Patent does not need any further PT services    Follow Up Recommendations   (for OT)    Does the patient have the potential to tolerate intense rehabilitation      Barriers to Discharge        Equipment Recommendations  None recommended by PT    Recommendations for Other Services OT consult   Frequency      Precautions / Restrictions Precautions Precautions: Fall Restrictions Weight Bearing Restrictions: No   Pertinent Vitals/Pain       Mobility  Bed Mobility Overal bed mobility: Needs Assistance Bed  Mobility: Rolling Rolling: Max assist General bed mobility comments: pt is able to assist by pulling on railing with UEs Transfers Overall transfer level: Needs assistance General transfer comment: maxi move lift used to transfer pt bed to chair    Exercises     PT Diagnosis:    PT Problem List:   PT Treatment Interventions:       PT Goals(Current goals can be found in the care plan section) Acute Rehab PT Goals PT Goal Formulation: No goals set, d/c therapy  Visit Information  Last PT Received On: 03/08/13 History of Present Illness: Pt was admitted with dehydration.  She is a resident of [facility] and has been bedbound for the past 3 weeks due to a compression fx of the lumbar spine (L2 on 02-07-13).  Prior to that, pt was w/c dependent at [facility].  Daughter is concerned that pt has not been getting up to a chair at [facility]...they have told her that they could not do this without a brace on her.  While bedbound, pt has lost the ability to feed herself.       Prior [place] expects to be discharged to:: Skilled nursing facility Prior Function Level of Independence: Needs assistance Gait / Transfers Assistance Needed: non ambulatory, total assist with transfers ADL's / Homemaking Assistance Needed: total assist with bathing and dressing,  had been able to feed herself up until the past few weeks. Communication Communication: No difficulties    Cognition  Cognition Arousal/Alertness: Awake/alert Behavior During Therapy: WFL for tasks assessed/performed Overall Cognitive Status: History of cognitive impairments - at baseline    Extremity/Trunk Assessment Lower Extremity Assessment Lower Extremity Assessment: RLE deficits/detail;LLE deficits/detail RLE Deficits / Details: severe hip and knee flexion contractures but pt does have active motion in RLE...she is s/p TKR...pt has pain with certain movements in LE(no specific motion is responsible) LLE Deficits /  Details: severe hip and knee flexion contractures, minimal active motion observed...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

## 2013-03-08 NOTE — Progress Notes (Signed)
Discussed patient's problems with her daughter Abigail Butts. Abigail Butts would like neurology consultation which I have requested. I've also requested MRI brain scan. During our conversation, Abigail Butts became rather accusatory regarding care, implying that we were going to "push her mother out of the hospital". I've explained to Abigail Butts that this is clearly not the case and she should refrain from making such statements. I've explained to Abigail Butts that her mother is not quite ready for discharge and that her dementia may be quite severe limiting her improvement she may gain regardless of her other medical problems, which are multiple.

## 2013-03-08 NOTE — Progress Notes (Signed)
Pharmacy Consult: Lovenox for VTE prophylaxis.  Patient Measurements: Height: 5\' 4"  (162.6 cm) Weight: 156 lb 12 oz (71.1 kg) IBW/kg (Calculated) : 54.7 Body mass index is 26.89 kg/(m^2).  VITALS Filed Vitals:   03/08/13 0629  BP: 112/79  Pulse: 75  Temp: 98.4 F (36.9 C)  Resp: 20   CBC:    Component Value Date/Time   WBC 9.9 03/08/2013 0523   RBC 2.90* 03/08/2013 0523   HGB 9.3* 03/08/2013 0523   HCT 27.4* 03/08/2013 0523   PLT 220 03/08/2013 0523   MCV 94.5 03/08/2013 0523   MCH 32.1 03/08/2013 0523   MCHC 33.9 03/08/2013 0523   RDW 13.9 03/08/2013 0523   LYMPHSABS 0.8 03/06/2013 1945   MONOABS 0.5 03/06/2013 1945   EOSABS 0.0 03/06/2013 1945   BASOSABS 0.0 03/06/2013 1945   RENAL FUNCTION: Estimated Creatinine Clearance: 31.1 ml/min (by C-G formula based on Cr of 1.21).  Assessment: Dose stable for age, weight, renal function and indication.  Plan: Sign off.  Ena Dawley, [facility] 03/08/2013 10:44 AM

## 2013-03-08 NOTE — Clinical Social Work Note (Signed)
Spoke with daughter in room and notified her that [facility] was the facility interested. Daughter will call this afternoon and plans to be at facility around 9 tomorrow morning. PT just completed eval. CSW will fax PT notes to [facility] when notes available.   Benay Pike, [place]

## 2013-03-09 LAB — BASIC METABOLIC PANEL
BUN: 20 mg/dL (ref 6–23)
CHLORIDE: 101 meq/L (ref 96–112)
CO2: 24 mEq/L (ref 19–32)
Calcium: 8.3 mg/dL — ABNORMAL LOW (ref 8.4–10.5)
Creatinine, Ser: 1.05 mg/dL (ref 0.50–1.10)
GFR calc Af Amer: 53 mL/min — ABNORMAL LOW (ref >90)
GFR calc non Af Amer: 46 mL/min — ABNORMAL LOW (ref >90)
Glucose, Bld: 103 mg/dL — ABNORMAL HIGH (ref 70–99)
Potassium: 4.1 mEq/L (ref 3.7–5.3)
Sodium: 137 mEq/L (ref 137–147)

## 2013-03-09 LAB — GLUCOSE, CAPILLARY: Glucose-Capillary: 113 mg/dL — ABNORMAL HIGH (ref 70–99)

## 2013-03-09 LAB — VANCOMYCIN, TROUGH: Vancomycin Tr: 18.5 ug/mL (ref 10.0–20.0)

## 2013-03-09 MED ORDER — FUROSEMIDE 40 MG PO TABS: 40.0000 mg | ORAL_TABLET | Freq: Every day | ORAL | Status: AC

## 2013-03-09 MED ORDER — VANCOMYCIN HCL IN DEXTROSE 750-5 MG/150ML-% IV SOLN: 750.0000 mg | INTRAVENOUS | Status: DC

## 2013-03-09 MED ORDER — ENALAPRIL MALEATE 20 MG PO TABS: 20.0000 mg | ORAL_TABLET | Freq: Every day | ORAL | Status: AC

## 2013-03-09 MED ORDER — PIPERACILLIN-TAZOBACTAM 3.375 G IVPB: 3.3750 g | Freq: Three times a day (TID) | INTRAVENOUS | Status: DC

## 2013-03-09 NOTE — Clinical Social Work Placement (Signed)
Clinical Social Work Department CLINICAL SOCIAL WORK PLACEMENT NOTE 03/09/2013  Patient:  [name], [name]  Account Number:  [id number] Admit date:  03/04/2013  Clinical Social Worker:  Benay Pike, LCSW  Date/time:  03/05/2013 03:05 PM  Clinical Social Work is seeking post-discharge placement for this patient at the following level of care:   SKILLED NURSING   (*CSW will update this form in Epic as items are completed)   03/05/2013  Patient/family provided with [facility] Department of Clinical Social Work's list of facilities offering this level of care within the geographic area requested by the patient (or if unable, by the patient's family).  03/05/2013  Patient/family informed of their freedom to choose among providers that offer the needed level of care, that participate in Medicare, Medicaid or managed care program needed by the patient, have an available bed and are willing to accept the patient.  03/05/2013  Patient/family informed of [facility]' ownership interest in [facility], as well as of the fact that they are under no obligation to receive care at this facility.  PASARR submitted to [employer] on  PASARR number received from [facility] on   FL2 transmitted to all facilities in geographic area requested by pt/family on  03/05/2013 FL2 transmitted to all facilities within larger geographic area on 03/05/2013  Patient informed that his/her managed care company has contracts with or will negotiate with  certain facilities, including the following:     Patient/family informed of bed offers received:  03/09/2013 Patient chooses bed at [facility] Physician recommends and patient chooses bed at  [facility]  Patient to be transferred to [facility] on  03/09/2013 Patient to be transferred to facility by [facility]  The following physician request were entered in Epic:   Additional Comments: Pt has existing pasarr number.  Benay Pike,  [place]

## 2013-03-09 NOTE — Progress Notes (Signed)
Per nurse at [facility], Vancomycin trough is required to be drawn prior to transfer to facility.  Vancomycin level drawn this afternoon is within therapeutic range. This is not true trough, but shows patient is clearing Vancomycin as anticipated & expect true trough to still be >10.  Renal function has been improving to patient's baseline.  Patient is clinically improving.  OK to continue Vancomycin as ordered for 5 additional days.  No further lab work indicated.   Netta Cedars, PharmD, BCPS 03/09/2013@4 :32 PM

## 2013-03-09 NOTE — Discharge Summary (Signed)
[name], [name]             ACCOUNT NO.:  [id number]  MEDICAL RECORD NO.:  [id number]  LOCATION:  T244                          FACILITY:  [facility]  PHYSICIAN:  Estill Bamberg. Karie Kirks, M.D.DATE OF BIRTH:  1922-05-03  DATE OF ADMISSION:  03/04/2013 DATE OF DISCHARGE:  [facility]                              DISCHARGE SUMMARY   ADDENDUM:  This patient will need physical therapy and occupational therapy when she gets to the nursing home.  I think that once her pneumonia is treated and her back feels better that she does have fairly good rehab potential at least to the point of being comfortable, even though the dementia will not be cured.     Estill Bamberg. Karie Kirks, M.D.     SDK/MEDQ  D:  03/09/2013  T:  03/09/2013  Job:  [id number]

## 2013-03-09 NOTE — Progress Notes (Signed)
03/09/13 1732 Patient left floor in stable condition via stretcher and EMS transport. IV site to left hand, saline locked, within normal limits. Receiving nurse aware of IV access to be maintained for IV antibiotics. Daughter present at time of EMS transport arrival, aware of discharge plan. Discharged to [facility]. Donavan Foil, RN

## 2013-03-09 NOTE — Consult Note (Signed)
[place] A. Merlene Laughter, MD     [email or web address]          [name] is an 80 y.o. female.   ASSESSMENT/PLAN: 1. Acute/subacute encephalopathy. This is likely due to toxic metabolic derangements including dehydration, pneumonia, acute renal failure and hypernatremia. The patient should improve as her metabolic derangements and the infection improves. This may lag behind however.  2. Underlying dementia likely related to head injury 4 years ago. Consider evaluation for treable causes of dementia such as thyroid function test and vitamin B12 deficiency.  3. Multifactorial gait impairment including severe osteoarthritis, aging and sequelae you are from intracranial hemorrhage.   The patient is an 80 year old white female who presented to the hospital with altered mental status and pain. The patient was previously admitted to the hospital with back pain. She was diagnosed as having a compression fracture. It appeared that at that time the patient went to go to the bathroom and fell. She was discharged to nursing facility with pain medications. The patient workup reveals that she presented with hypernatremia in the 150s, acute renal failure and pneumonia. The patient's family indicates that since she fell 4 years ago, she has had some cognitive impairment which has not necessarily gotten worse but seems to have leveled off. However, over the past month her cognition is clearly got worse over baseline. Because of this, neurological consultation was obtained. The patient has had significant gait impairment ever since her head injury 4 years ago. The patient's head injury was quite severe results in and subdural hematoma requiring bifrontal surgery. Again, since that injury, the patient has had some difficulties with short-term memory impairment and cognition although not progressive. She also has had gait impairment since then. In fact, the daughter tells me that the patient has  not ambulated for over the past 2 years. Review of systems is limited due to the cognition but essentially as stated above per the family.  GENERAL: The patient is laying in bed sleeping and in no acute distress. She asked several time for snuff however.  HEENT: Neck is supple and head is normocephalic/atraumatic.  ABDOMEN: soft  EXTREMITIES: There is severe osteophytic changes of the knees bilaterally. There is 1+ edema of the legs and ankle. There appears to be some evidence of contracture of the ankles and significant pain with movement of the legs in general.  BACK: This is unremarkable.  SKIN: Normal by inspection.    MENTAL STATUS: The patient is in bed with eyes closed. She spontaneously opens her eyes and communicates with her family. She follows commands well. She is oriented 1. She thinks that she is in the [place] at the nursing facility. No dysarthrias is observed.  CRANIAL NERVES: Pupils are equal, round and reactive to light and accommodation; extra ocular movements are full, there is no significant nystagmus; visual fields are full; upper and lower facial muscles are normal in strength and symmetric, there is no flattening of the nasolabial folds; tongue is midline; uvula is midline; shoulder elevation is normal.  MOTOR: Upper extremity strength is about 4/5. She has antigravity strength of the legs.  COORDINATION: Left finger to nose is normal, right finger to nose is normal, No rest tremor; no intention tremor; no postural tremor; no bradykinesia.  REFLEXES: Deep tendon reflexes are symmetrical and normal except for the knees where they are absent. Plantar responses are flexor bilaterally.   SENSATION: Normal to pain.  Past Medical History  Diagnosis Date  . Breast cancer   .  Lung mass   . Venous insufficiency   . Senile macular degeneration   . Subdural hemorrhage following injury, without mention of open intracranial wound, unspecified state of consciousness     . Unspecified kidney injury without mention of open wound into cavity   . Anxiety state, unspecified   . Hyposmolality and/or hyponatremia   . Esophageal reflux   . Hypertension   . Acute renal failure syndrome   . Sepsis   . Dementia   . Mass of lung 06/20/2012  . Compression fracture of lumbar vertebra     L2, L4   . DDD (degenerative disc disease), lumbar     History reviewed. No pertinent past surgical history.  History reviewed. No pertinent family history.  Social History:  reports that she has never smoked. She has quit using smokeless tobacco. She reports that she does not drink alcohol. Her drug history is not on file.  Allergies: No Known Allergies  Medications:  Prior to Admission medications   Medication Sig Start Date End Date Taking? Authorizing Provider  acetaminophen (TYLENOL) 500 MG tablet Take 500 mg by mouth every 6 (six) hours as needed for pain.   Yes Historical Provider, MD  amLODipine (NORVASC) 10 MG tablet Take 10 mg by mouth daily.   Yes Historical Provider, MD  beta carotene w/minerals (OCUVITE) tablet Take 1 tablet by mouth daily.   Yes Historical Provider, MD  cholecalciferol (VITAMIN D) 1000 UNITS tablet Take 1,000 Units by mouth 2 (two) times daily.    Yes Historical Provider, MD  clonazePAM (KLONOPIN) 0.5 MG tablet Take 0.25 mg by mouth daily.   Yes Historical Provider, MD  fentaNYL (DURAGESIC - DOSED MCG/HR) 50 MCG/HR Place 1 patch (50 mcg total) onto the skin every 3 (three) days. 02/20/13  Yes Rosita Fire, MD  HYDROcodone-acetaminophen (NORCO/VICODIN) 5-325 MG per tablet Take 1-2 tablets by mouth every 4 (four) hours as needed for moderate pain. 02/19/13  Yes Rosita Fire, MD  iron polysaccharides (POLY-IRON 150) 150 MG capsule Take 150 mg by mouth daily.   Yes Historical Provider, MD  senna (SENOKOT) 8.6 MG tablet Take 1 tablet by mouth 2 (two) times daily.   Yes Historical Provider, MD  enalapril (VASOTEC) 20 MG tablet Take 1 tablet (20 mg  total) by mouth daily. 03/09/13   Robert Bellow, MD  furosemide (LASIX) 40 MG tablet Take 1 tablet (40 mg total) by mouth daily. 03/09/13   Robert Bellow, MD  piperacillin-tazobactam (ZOSYN) 3.375 GM/50ML IVPB Inject 50 mLs (3.375 g total) into the vein every 8 (eight) hours. 03/09/13   Robert Bellow, MD  Vancomycin (VANCOCIN) 750 MG/150ML SOLN Inject 150 mLs (750 mg total) into the vein daily. 03/09/13   Robert Bellow, MD     Scheduled Meds: . amLODipine  10 mg Oral Daily  . beta carotene w/minerals  1 tablet Oral Daily  . cholecalciferol  1,000 Units Oral BID  . clonazePAM  0.25 mg Oral QHS  . enoxaparin (LOVENOX) injection  40 mg Subcutaneous Daily  . iron polysaccharides  150 mg Oral Daily  . piperacillin-tazobactam (ZOSYN)  IV  3.375 g Intravenous Q8H  . senna  1 tablet Oral BID  . vancomycin  750 mg Intravenous Q24H   Continuous Infusions: . dextrose 5 % with kcl 50 mL/hr at 03/09/13 0545   PRN Meds:.acetaminophen, HYDROcodone-acetaminophen    Blood pressure 154/57, pulse 67, temperature 98.3 F (36.8 C), temperature source Oral, resp. rate 20, height $RemoveBe'5\' 4"'[id number]$  (1.626  m), weight 72.4 kg (159 lb 9.8 oz), SpO2 98.00%.   Results for orders placed during the hospital encounter of 03/04/13 (from the past 48 hour(s))  CBC     Status: Abnormal   Collection Time    03/08/13  5:23 AM      Result Value Range   WBC 9.9  4.0 - 10.5 K/uL   RBC 2.90 (*) 3.87 - 5.11 MIL/uL   Hemoglobin 9.3 (*) 12.0 - 15.0 g/dL   HCT 53.9 (*) 67.2 - 89.7 %   MCV 94.5  78.0 - 100.0 fL   MCH 32.1  26.0 - 34.0 pg   MCHC 33.9  30.0 - 36.0 g/dL   RDW 91.5  04.1 - 36.4 %   Platelets 220  150 - 400 K/uL  COMPREHENSIVE METABOLIC PANEL     Status: Abnormal   Collection Time    03/08/13  5:23 AM      Result Value Range   Sodium 144  137 - 147 mEq/L   Comment: DELTA CHECK NOTED   Potassium 3.8  3.7 - 5.3 mEq/L   Chloride 106  96 - 112 mEq/L   CO2 25  19 - 32 mEq/L   Glucose, Bld 104 (*) 70  - 99 mg/dL   BUN 31 (*) 6 - 23 mg/dL   Creatinine, Ser 3.83 (*) 0.50 - 1.10 mg/dL   Calcium 8.0 (*) 8.4 - 10.5 mg/dL   Total Protein 5.9 (*) 6.0 - 8.3 g/dL   Albumin 2.0 (*) 3.5 - 5.2 g/dL   AST 24  0 - 37 U/L   ALT 21  0 - 35 U/L   Alkaline Phosphatase 34 (*) 39 - 117 U/L   Total Bilirubin 0.6  0.3 - 1.2 mg/dL   GFR calc non Af Amer 39 (*) >90 mL/min   GFR calc Af Amer 45 (*) >90 mL/min   Comment: (NOTE)     The eGFR has been calculated using the CKD EPI equation.     This calculation has not been validated in all clinical situations.     eGFR's persistently <90 mL/min signify possible Chronic Kidney     Disease.  BASIC METABOLIC PANEL     Status: Abnormal   Collection Time    03/09/13  5:29 AM      Result Value Range   Sodium 137  137 - 147 mEq/L   Comment: DELTA CHECK NOTED   Potassium 4.1  3.7 - 5.3 mEq/L   Chloride 101  96 - 112 mEq/L   CO2 24  19 - 32 mEq/L   Glucose, Bld 103 (*) 70 - 99 mg/dL   BUN 20  6 - 23 mg/dL   Creatinine, Ser 7.79  0.50 - 1.10 mg/dL   Calcium 8.3 (*) 8.4 - 10.5 mg/dL   GFR calc non Af Amer 46 (*) >90 mL/min   GFR calc Af Amer 53 (*) >90 mL/min   Comment: (NOTE)     The eGFR has been calculated using the CKD EPI equation.     This calculation has not been validated in all clinical situations.     eGFR's persistently <90 mL/min signify possible Chronic Kidney     Disease.  GLUCOSE, CAPILLARY     Status: Abnormal   Collection Time    03/09/13 12:22 PM      Result Value Range   Glucose-Capillary 113 (*) 70 - 99 mg/dL   Comment 1 Notify RN      Mr Brain  Wo Contrast  03/08/2013   CLINICAL DATA:  80 year old female with altered mental status and confusion. Fall. Initial encounter.  EXAM: MRI HEAD WITHOUT CONTRAST  TECHNIQUE: Multiplanar, multiecho pulse sequences of the brain and surrounding structures were obtained without intravenous contrast.  COMPARISON:  Head CTs without contrast 03/04/2013 and earlier.  FINDINGS: Study is intermittently  degraded by motion artifact despite repeated imaging attempts.  No restricted diffusion or evidence of acute infarction. Major intracranial vascular flow voids are preserved.  Stable ventricles, suspect ex vacuo enlargement. No midline shift, mass effect, or evidence of intracranial mass lesion. No acute intracranial hemorrhage identified. Nonspecific periventricular white matter T2 and FLAIR hyperintensity. No definite cortical encephalomalacia. Deep gray matter nuclei, brainstem and cerebellum within normal limits for age. Negative pituitary, cervicomedullary junction and visualized cervical spine. Normal bone marrow signal. Previous right parietal craniotomy near the vertex.  Postoperative changes to the globes. Minor mastoid air cell fluid. Paranasal sinuses are clear. Visualized scalp soft tissues are within normal limits.  IMPRESSION: 1.  No acute intracranial abnormality. 2. Stable appearance of the brain. Intermittently motion degraded study despite repeated imaging attempts.   Electronically Signed   By: Lars Pinks M.D.   On: 03/08/2013 14:50        [place] Hulgan A. Merlene Laughter, M.D.  Diplomate, Tax adviser of Psychiatry and Neurology ( Neurology). 03/09/2013, 2:12 PM

## 2013-03-09 NOTE — Progress Notes (Signed)
03/09/13 1651 Discharge summary faxed to [facility] as instructed per SW. Called report to receiving nurse at [place]. IV site to remain in place for discharge. Pt in stable condition awaiting EMS arrival for transport. Daughter at bedside this afternoon. Donavan Foil, RN

## 2013-03-09 NOTE — Discharge Summary (Signed)
[name], [name]             ACCOUNT NO.:  [id number]  MEDICAL RECORD NO.:  [id number]  LOCATION:  H852                          FACILITY:  [facility]  PHYSICIAN:  Estill Bamberg. Karie Kirks, M.D.DATE OF BIRTH:  [birth year]  DATE OF ADMISSION:  03/04/2013 DATE OF DISCHARGE:  LH                              DISCHARGE SUMMARY   This 80 year old presented to [facility] Emergency Room, with what later proved to be pneumonia.  She developed severe weakness at [facility] and fell.  She was brought to the emergency room for evaluation.  She had a benign 6-day hospitalization extending from March 04, 2013 to March 09, 2013.  Her vital signs remained stable.  Her admission white cell count was 18,700.  This gradually dropped to 9900 the day prior to discharge.  She had 88% neutrophils, rechecked 86% and 90% neutrophils.  Her admission sodium was 150, BUN 63, creatinine 2.32, but at the time of discharge, her sodium was 137 and her BUN was 20 with a creatinine of 1.05.  Her troponin was less than 0.30 on admission, and her pro beta natriuretic peptide was 880.5.  Lactic acid was 1.4.  Sugars were in the low 100 range, and D-dimer 1.87.  Her UA showed 3-6 wbc's and 0-2 rbc's with rare bacteria.  Microbiology showed negative MRSA by PCR.  Her fecal occult blood was negative.  She had a CT scan of the head on admission.  This showed severe cortical and deep atrophy and mild cerebellar atrophy as well as moderate chronic microvascular ischemic changes of the white matter.  The study was somewhat motion degraded.  Her admission chest x-ray on March 04, 2013, showed suspected left lateral fifth through seventh rib fractures and a possible pleural effusion but no pneumothorax.  The following day, there was progression of bilateral airspace disease suggestive of fluid overload, but underlying pneumonia in the left lower lobe was not excluded.  A portable chest done  2 days prior to discharge showed partial clearing of the congestive heart failure, pulmonary edema with basilar atelectasis and/or pneumonia findings.  X-ray of her left hip showed no osseous injury.  Her lumbar spine x-rays done when she came in the hospital showed mild-to-moderate superior endplate compression deformities at L2 and L4, felt to be chronic.  She also had multilevel changes.  Her prior lumbar x-rays done February 13, 2013 showed a mild L2 fracture, which was acute and a mild fracture of L4 of indeterminate age.  She was admitted to the hospital.  She had a lot of back pain initially but this seemed to resolve over the next few days and she seemed more comfortable.  She had been on a fentanyl patch and this was stopped, and she was just treated with hydrocodone/APAP for pain.  After the initial workup in the emergency room, she was given a bolus of normal saline and then started on vancomycin and Zosyn.  She required morphine IV due to her pain.  She was continued on acetaminophen, amlodipine, beta-carotene, cholecalciferol, clonazepam, and iron but enalapril was stopped because her blood pressure was somewhat low when she came in.  She was given oxygen.  She gradually  seemed to improve.  She came in just moaning with pain, unable to say anything, but 2 days later, she was talking some.  By the day of discharge, she was able to answer questions using full sentences. She is still somewhat disoriented but much improved.  FINAL DISCHARGE DIAGNOSES: 1. Pneumonia. 2. Low back pain, improved. 3. Status post lumbar compression fractures x2. 4. Dehydration, cleared. 5. Hypernatremia and electrolyte abnormalities, cleared. 6. Dementia with further mental status changes secondary to pneumonia.  PLAN:  She is being discharged back to the nursing facility on acetaminophen 500 mg q.6 h. for pain, amlodipine 10 mg daily, beta- carotene with minerals daily, cholecalciferol  1000 units b.i.d., clonazepam 0.5 mg half a tablet daily for sleep, enalapril 20 mg a day as long as her systolic blood pressure is not less than 130, furosemide 40 mg 1 a day, hydrocodone/APAP 5/325 as many as 1-2 tablets every 4 hours, Poly Iron 150 mg daily, and senna 8.6 mg b.i.d.  It would be good for her to use as little hydrocodone/APAP as possible.  If she develops more edema, it may be that she will need furosemide b.i.d.  Fentanyl was discontinued while she was in the hospital because of effects on the elderly.  She will need to be on 5 more days of IV vancomycin at 150 mL daily and 5 more days of piperacillin/tazobactam at 50 mg q.8 h., that should give her a full 10-day course of IV antibiotics for her pneumonia.     Estill Bamberg. Karie Kirks, M.D.     SDK/MEDQ  D:  03/09/2013  T:  03/09/2013  Job:  [id number]

## 2013-03-09 NOTE — Clinical Social Work Note (Signed)
Pt d/c today to SNF. Met with daughter at bedside who reports she did not go to [place] this morning and requests return to [facility]. Facility aware and agreeable. Pt to transfer via [facility]. [facility] aware of peripheral IV for antibiotics. Spoke with Jackelyn Poling at [facility] regarding request to switch to Dr. Maudie Mercury at facility. She will follow up on. Will fax d/c summary when available.   Benay Pike, [place]

## 2013-03-09 NOTE — Clinical Social Work Note (Signed)
RN to fax rest of d/c summary when available and call EMS when ready.   [name], [place]

## 2013-03-12 ENCOUNTER — Ambulatory Visit (HOSPITAL_COMMUNITY)
Admission: RE | Admit: 2013-03-12 | Discharge: 2013-03-12 | Disposition: A | Discharge status: Home / Self Care | Payer: PRIVATE HEALTH INSURANCE | Source: Ambulatory Visit | Attending: Internal Medicine | Admitting: Internal Medicine

## 2013-03-12 DIAGNOSIS — J189 Pneumonia, unspecified organism: Secondary | ICD-10-CM | POA: Insufficient documentation

## 2013-03-12 NOTE — Discharge Instructions (Signed)
Peripherally Inserted Central Catheter (PICC) Removal and Care After A peripherally inserted catheter (PICC) is removed when it is no longer needed, when it is clotted, or when it may be infected.  PROCEDURE  The removal of a PICC is usually painless. Removing the tape that holds the PICC in place may be the most discomfort you have.  A physicians order needs to be obtained to have the PICC removed.  A PICC can be removed in the hospital or in an outpatient setting.  Never remove or take out the PICC yourself. Only a trained clinical professional, such as a PICC nurse, should remove the PICC.  If a PICC is suspected to be infected, the PICC tip is sent to the lab for culture. HOME CARE INSTRUCTIONS  When the PICC is out, pressure is applied at the insertion site to prevent bleeding. An antibiotic ointment may be applied to the insertion site. A dry, sterile gauze is then taped over the insertion site. This dressing should stay on for 24 hours.  After the 24 hours is up, the dressing may be removed. The PICC insertion site is very small. A small scab may develop over the insertion site. It is okay to wash the site gently with soap and water. Be careful to not remove or pick the scab off. After washing, gently pat the site dry. You do not need to put another dressing over the insertion site after you wash it.  Avoid heavy, strenuous physical activity for 24 hours after the PICC is removed. This includes things like:  Weight lifting.  Strenuous yard work.  Any physical activity with repetitive arm movement. SEEK MEDICAL CARE IF:  Call or see your caregiver as soon as possible if you develop the following conditions in the arm in which the PICC was inserted:  Swelling or puffiness.  Increasing tenderness or pain. SEEK IMMEDIATE MEDICAL CARE IF:  You develop any of the following conditions in the arm that had the PICC:  Numbness or tingling in your fingers, hand, or arm.  You arm has  a bluish color and it is cold to the touch.  Redness around the insertion site or a red-streak that goes up your arm.  Any type of drainage from the PICC insertion site. This includes drainage such as:  Bleeding from the insertion site. (If this happens, apply firm, direct pressure to the PICC insertion site with a clean towel.)  Drainage that is yellow or tan in color.  You have an oral temperature above 102 F (38.9 C), not controlled by medicine. Document Released: 07/29/2009 Document Revised: 05/03/2011 Document Reviewed: 07/29/2009 [facility] Patient Information 2014 [place]. PICC Insertion, Care After Refer to this sheet in the next few weeks. These instructions provide you with information on caring for yourself after your procedure. Your health care provider may also give you more specific instructions. Your treatment has been planned according to current medical practices, but problems sometimes occur. Call your health care provider if you have any problems or questions after your procedure. WHAT TO EXPECT AFTER THE PROCEDURE After your procedure, it is typical to have the following:  Mild discomfort at the insertion site. This should not last more than a day. HOME CARE INSTRUCTIONS  Rest at home for the remainder of the day after the procedure.  You may bend your arm and move it freely. If your PICC is near or at the bend of your elbow, avoid activity with repeated motion at the elbow.  Avoid  lifting heavy objects as instructed by your health care provider.  Avoid using a crutch with the arm on the same side as your PICC. You may need to use a walker. Bandage Care  Keep your PICC bandage (dressing) clean and dry to prevent infection.  Ask your health care provider when you may shower. To keep the dressing dry, cover the PICC with plastic wrap and tape before showering. If the dressing does become wet, replace it right after the shower.  Do not soak in the bath,  swim, or use hot tubs when you have a PICC.  Change the PICC dressing as instructed by your health care provider.  Change your PICC dressing if it becomes loose or wet. General PICC Care  Check the PICC insertion site daily for leakage, redness, swelling, or pain.  Flush the PICC as directed by your health care provider. Let your health care provider know right away if the PICC is difficult to flush or does not flush. Do not use force to flush the PICC.   Do not use a syringe that is less than 10 mL to flush the PICC.  Never pull or tug on the PICC.  Avoid blood pressure checks on the arm with the PICC.  Keep your PICC identification card with you at all times.  Do not take the PICC out yourself. Only a trained health care professional should remove the PICC.  SEEK MEDICAL CARE IF:  You have pain in your arm, ear, face, or teeth.  You have fever or chills.  You have drainage from the PICC insertion site.  You have redness or palpate a "cord" around the PICC insertion site.  You cannot flush the catheter. SEEK IMMEDIATE MEDICAL CARE IF:  You have swelling in the arm in which the PICC is inserted. Document Released: 11/29/2012 Document Reviewed: 10/16/2012 [facility] Patient Information 2014 [place], [place]. PICC Insertion A peripherally inserted central catheter (PICC) is a long, thin, flexible tube that is inserted into a vein in the upper arm. It is a form of IV access. It is considered to be a "central" line because the tip of the PICC ends in a large vein in your chest. This large vein is called the superior vena cava (SVC). The PICC tip ends in the SVC because there is a lot of blood flow in the SVC. This allows medicines and IV fluids to be quickly distributed throughout the body. The PICC is inserted using a sterile technique by a specially trained health care provider. After the PICC is inserted, a chest X-ray may be done to ensure that it is in the correct place.  LET  [facility] CARE PROVIDER KNOW ABOUT:   Any allergies you have.  All medicines you are taking, including vitamins, herbs, eye drops, creams, and over-the-counter medicines.  Previous problems you or members of your family have had with the use of anesthetics.  Any blood disorders you have.  Previous surgeries you have had.  Medical conditions you have. RISKS AND COMPLICATIONS Generally, this is a safe procedure. However, as with any procedure, complications can occur. Possible complications include:  Infection at the insertion site or in the blood.  Bleeding at the insertion site or internally.  Injury or collapse of the lung.  Movement or malposition of the PICC.  Inflammation of the vein (phlebitis).  Nerve injury or irritation.  Clot formation at the tip of the PICC line.  Blood clot in the lung (pulmonary embolus).  Injury to the  large blood vessels or heart (rare). BEFORE THE PROCEDURE   Your health care provider may want you to have blood tests. These tests can help tell how well your blood clots.  If you take blood thinners (anticoagulant medicine), ask your health care provider if you should stop taking them. PROCEDURE   You will have to lie flat for about 30 45 minutes for this procedure.  Your health care provider will start by identifying a vein into which the PICC line will be placed. This is done using ultrasound or X-ray guidance.  Medicine is used to numb the skin around the insertion site.  The skin where the catheter will be inserted is cleaned and covered with a sterile surgical drape.  A small needle is inserted into the vein, and then a small guidewire is advanced into the superior vena cava.  The catheter is then advanced over the guidewire and moved into position. The guidewire is then removed.  The catheter is flushed and blood is drawn back to confirm it is in the vein.  If this is done without X-ray guidance, an X-ray will be needed to  make sure the catheter tip is in the correct position.  Once the placement is confirmed, the PICC is secured to the skin with a device and covered with a sterile dressing AFTER THE PROCEDURE  You should avoid any strenuous activity for the next 2 days or as directed by your health care provider.  You will be allowed to go back to your regular activities after the procedure.  You will be instructed on the care of your PICC line. Document Released: 11/29/2012 Document Reviewed: 10/16/2012 [facility] Patient Information 2014 [place], [place]. PICC Home Guide A peripherally inserted central catheter (PICC) is a long, thin, flexible tube that is inserted into a vein in the upper arm. It is a form of intravenous (IV) access. It is considered to be a "central" line because the tip of the PICC ends in a large vein in your chest. This large vein is called the superior vena cava (SVC). The PICC tip ends in the SVC because there is a lot of blood flow in the SVC. This allows medicines and IV fluids to be quickly distributed throughout the body. The PICC is inserted using a sterile technique by a specially trained nurse or physician. After the PICC is inserted, a chest X-ray exam is done to be sure it is in the correct place.  A PICC may be placed for different reasons, such as:  To give medicines and liquid nutrition that can only be given through a central line. Examples are:  Certain antibiotic treatments.  Chemotherapy.  Total parenteral nutrition (TPN).  To take frequent blood samples.  To give IV fluids and blood products.  If there is difficulty placing a peripheral intravenous (PIV) catheter. If taken care of properly, a PICC can remain in place for several months. A PICC can also allow a person to go home from the hospital early. Medicine and PICC care can be managed at home by a family member or home health care team. [place] A PICC? Problems with a PICC can  occasionally occur. These may include:  A blood clot (thrombus) forming in or at the tip of the PICC. This can cause the PICC to become clogged. A clot-dissolving medicine called tissue plasminogen activator (tPA) can be given through the PICC to help break up the clot.  Inflammation of the vein (phlebitis)  in which the PICC is placed. Signs of inflammation may include redness, pain at the insertion site, red streaks, or being able to feel a "cord" in the vein where the PICC is located.  Infection in the PICC or at the insertion site. Signs of infection may include fever, chills, redness, swelling, or pus drainage from the PICC insertion site.  PICC movement (malposition). The PICC tip may move from its original position due to excessive physical activity, forceful coughing, sneezing, or vomiting.  A break or cut in the PICC. It is important to not use scissors near the PICC.  Nerve or tendon irritation or injury during PICC insertion. WHAT SHOULD I KEEP IN MIND ABOUT ACTIVITIES WHEN I HAVE A PICC?  You may bend your arm and move it freely. If your PICC is near or at the bend of your elbow, avoid activity with repeated motion at the elbow.  Rest at home for the remainder of the day following PICC line insertion.  Avoid lifting heavy objects as instructed by your health care provider.  Avoid using a crutch with the arm on the same side as your PICC. You may need to use a walker. WHAT SHOULD I KNOW ABOUT MY PICC DRESSING?  Keep your PICC bandage (dressing) clean and dry to prevent infection.  Ask your health care provider when you may shower. Ask your health care provider to teach you how to wrap the PICC when you do take a shower.  Change the PICC dressing as instructed by your health care provider.  Change your PICC dressing if it becomes loose or wet. WHAT SHOULD I KNOW ABOUT PICC CARE?  Check the PICC insertion site daily for leakage, redness, swelling, or pain.  Do not take a  bath, swim, or use hot tubs when you have a PICC. Cover PICC line with clear plastic wrap and tape to keep it dry while showering.  Flush the PICC as directed by your health care provider. Let your health care provider know right away if the PICC is difficult to flush or does not flush. Do not use force to flush the PICC.  Do not use a syringe that is less than 10 mL to flush the PICC.  Never pull or tug on the PICC.  Avoid blood pressure checks on the arm with the PICC.  Keep your PICC identification card with you at all times.  Do not take the PICC out yourself. Only a trained clinical professional should remove the PICC. SEEK IMMEDIATE MEDICAL CARE IF:  Your PICC is accidently pulled all the way out. If this happens, cover the insertion site with a bandage or gauze dressing. Do not throw the PICC away. Your health care provider will need to inspect it.  Your PICC was tugged or pulled and has partially come out. Do not  push the PICC back in.  There is any type of drainage, redness, or swelling where the PICC enters the skin.  You cannot flush the PICC, it is difficult to flush, or the PICC leaks around the insertion site when it is flushed.  You hear a "flushing" sound when the PICC is flushed.  You have pain, discomfort, or numbness in your arm, shoulder, or jaw on the same side as the PICC.  You feel your heart "racing" or skipping beats.  You notice a hole or tear in the PICC.  You develop chills or a fever. MAKE SURE YOU:   Understand these instructions.  Will watch your condition.  Will get help right away if you are not doing well or get worse. Document Released: 08/15/2002 Document Revised: 11/29/2012 Document Reviewed: 10/16/2012 [facility] Patient Information 2014 [place].

## 2013-03-12 NOTE — Progress Notes (Signed)
Reported to nancy turner LPN. Papers sent back with EMS>

## 2013-03-12 NOTE — Progress Notes (Signed)
Patient here for picc line placement for pneumonia. Receiving vancomycin at [facility].

## 2013-11-07 ENCOUNTER — Ambulatory Visit (HOSPITAL_COMMUNITY)
Admission: RE | Admit: 2013-11-07 | Discharge: 2013-11-07 | Disposition: A | Discharge status: Home / Self Care | Payer: PRIVATE HEALTH INSURANCE | Source: Ambulatory Visit | Attending: Internal Medicine | Admitting: Internal Medicine

## 2013-11-07 DIAGNOSIS — Z452 Encounter for adjustment and management of vascular access device: Secondary | ICD-10-CM | POA: Insufficient documentation

## 2013-11-07 DIAGNOSIS — T80219D Unspecified infection due to central venous catheter, subsequent encounter: Secondary | ICD-10-CM

## 2013-11-07 MED ORDER — SODIUM CHLORIDE 0.9 % IJ SOLN
INTRAMUSCULAR | Status: AC
  Filled 2013-11-07: qty 10

## 2013-11-07 NOTE — Progress Notes (Addendum)
Peripherally Inserted Central Catheter/Midline Placement  The IV Nurse has discussed with the patient and/or persons authorized to consent for the patient, the purpose of this procedure and the potential benefits and risks involved with this procedure.  The benefits include less needle sticks, lab draws from the catheter and patient may be discharged home with the catheter.  Risks include, but not limited to, infection, bleeding, blood clot (thrombus formation), and puncture of an artery; nerve damage and irregular heat beat.  Alternatives to this procedure were also discussed.  PICC/Midline Placement Documentation  PICC / Midline Single Lumen 96/78/93 PICC Left Basilic 42 cm 0 cm (Active)     PICC / Midline Single Lumen 81/01/75 PICC Left Basilic 41 cm 0 cm (Active)  Exposed Catheter (cm) 5 cm 11/07/2013 12:00 PM  Site Assessment Clean;Dry;Intact 11/07/2013 12:00 PM  Line Status Capped (central line);Blood return noted 11/07/2013 12:00 PM  Dressing Type Transparent;Securing device 11/07/2013 12:00 PM  Dressing Status Clean;Dry;Intact 11/07/2013 12:00 PM  Dressing Intervention New dressing 11/07/2013 12:00 PM    Used fluoroscopy to direct end of catheter into SVC with radiologist .   Hillery Jacks 11/07/2013, 12:30 PM Peripherally Inserted Central Catheter/Midline Placement  The IV Nurse has discussed with the patient and/or persons authorized to consent for the patient, the purpose of this procedure and the potential benefits and risks involved with this procedure.  The benefits include less needle sticks, lab draws from the catheter and patient may be discharged home with the catheter.  Risks include, but not limited to, infection, bleeding, blood clot (thrombus formation), and puncture of an artery; nerve damage and irregular heat beat.  Alternatives to this procedure were also discussed.  PICC/Midline Placement Documentation  PICC / Midline Single Lumen 12/17/83 PICC Left Basilic 42 cm 0  cm (Active)     PICC / Midline Single Lumen 27/78/24 PICC Left Basilic 41 cm 0 cm (Active)  Exposed Catheter (cm) 5 cm 11/07/2013  3:00 PM  Site Assessment Clean;Dry;Intact 11/07/2013 12:00 PM  Line Status Capped (central line);Blood return noted 11/07/2013 12:00 PM  Dressing Type Transparent;Securing device 11/07/2013 12:00 PM  Dressing Status Clean;Dry;Intact 11/07/2013 12:00 PM  Line Adjustment (NICU/IV Team Only) Yes 11/07/2013  3:00 PM  Dressing Intervention New dressing 11/07/2013  3:00 PM       Naaman Plummer 11/07/2013, 3:49 PM

## 2013-11-07 NOTE — Progress Notes (Signed)
Patient ID: [name], female   DOB: 1922-04-07, 80 y.o.   MRN: [id number] Patient seen in radiology after Picc placement for fluoro guided re positioning.  Catheter flushed vigorously and repositioned in SVC without difficulty.

## 2013-11-07 NOTE — Discharge Instructions (Signed)
PICC Insertion, Care After Refer to this sheet in the next few weeks. These instructions provide you with information on caring for yourself after your procedure. Your health care provider may also give you more specific instructions. Your treatment has been planned according to current medical practices, but problems sometimes occur. Call your health care provider if you have any problems or questions after your procedure. WHAT TO EXPECT AFTER THE PROCEDURE After your procedure, it is typical to have the following:  Mild discomfort at the insertion site. This should not last more than a day. HOME CARE INSTRUCTIONS  Rest at home for the remainder of the day after the procedure.  You may bend your arm and move it freely. If your PICC is near or at the bend of your elbow, avoid activity with repeated motion at the elbow.  Avoid lifting heavy objects as instructed by your health care provider.  Avoid using a crutch with the arm on the same side as your PICC. You may need to use a walker. Bandage Care  Keep your PICC bandage (dressing) clean and dry to prevent infection.  Ask your health care provider when you may shower. To keep the dressing dry, cover the PICC with plastic wrap and tape before showering. If the dressing does become wet, replace it right after the shower.  Do not soak in the bath, swim, or use hot tubs when you have a PICC.  Change the PICC dressing as instructed by your health care provider.  Change your PICC dressing if it becomes loose or wet. General PICC Care  Check the PICC insertion site daily for leakage, redness, swelling, or pain.  Flush the PICC as directed by your health care provider. Let your health care provider know right away if the PICC is difficult to flush or does not flush. Do not use force to flush the PICC.  Do not use a syringe that is less than 10 mL to flush the PICC.  Never pull or tug on the PICC.  Avoid blood pressure checks on the arm  with the PICC.  Keep your PICC identification card with you at all times.  Do not take the PICC out yourself. Only a trained health care professional should remove the PICC. SEEK MEDICAL CARE IF:  You have pain in your arm, ear, face, or teeth.  You have fever or chills.  You have drainage from the PICC insertion site.  You have redness or palpate a "cord" around the PICC insertion site.  You cannot flush the catheter. SEEK IMMEDIATE MEDICAL CARE IF:  You have swelling in the arm in which the PICC is inserted. Document Released: 11/29/2012 Document Revised: 02/13/2013 Document Reviewed: 11/29/2012 [facility] Patient Information 2015 [place], [place]. This information is not intended to replace advice given to you by your health care provider. Make sure you discuss any questions you have with your health care provider. PICC Home Guide A peripherally inserted central catheter (PICC) is a long, thin, flexible tube that is inserted into a vein in the upper arm. It is a form of intravenous (IV) access. It is considered to be a "central" line because the tip of the PICC ends in a large vein in your chest. This large vein is called the superior vena cava (SVC). The PICC tip ends in the SVC because there is a lot of blood flow in the SVC. This allows medicines and IV fluids to be quickly distributed throughout the body. The PICC is inserted using a  sterile technique by a specially trained nurse or physician. After the PICC is inserted, a chest X-ray exam is done to be sure it is in the correct place.  °A PICC may be placed for different reasons, such as: °· To give medicines and liquid nutrition that can only be given through a central line. Examples are: °· Certain antibiotic treatments. °· Chemotherapy. °· Total parenteral nutrition (TPN). °· To take frequent blood samples. °· To give IV fluids and blood products. °· If there is difficulty placing a peripheral intravenous (PIV) catheter. °If taken care  of properly, a PICC can remain in place for several months. A PICC can also allow a person to go home from the hospital early. Medicine and PICC care can be managed at home by a family member or home health care team. °WHAT PROBLEMS CAN HAPPEN WHEN I HAVE A PICC? °Problems with a PICC can occasionally occur. These may include the following: °· A blood clot (thrombus) forming in or at the tip of the PICC. This can cause the PICC to become clogged. A clot-dissolving medicine called tissue plasminogen activator (tPA) can be given through the PICC to help break up the clot. °· Inflammation of the vein (phlebitis) in which the PICC is placed. Signs of inflammation may include redness, pain at the insertion site, red streaks, or being able to feel a "cord" in the vein where the PICC is located. °· Infection in the PICC or at the insertion site. Signs of infection may include fever, chills, redness, swelling, or pus drainage from the PICC insertion site. °· PICC movement (malposition). The PICC tip may move from its original position due to excessive physical activity, forceful coughing, sneezing, or vomiting. °· A break or cut in the PICC. It is important to not use scissors near the PICC. °· Nerve or tendon irritation or injury during PICC insertion. °WHAT SHOULD I KEEP IN MIND ABOUT ACTIVITIES WHEN I HAVE A PICC? °· You may bend your arm and move it freely. If your PICC is near or at the bend of your elbow, avoid activity with repeated motion at the elbow. °· Rest at home for the remainder of the day following PICC line insertion. °· Avoid lifting heavy objects as instructed by your health care provider. °· Avoid using a crutch with the arm on the same side as your PICC. You may need to use a walker. °WHAT SHOULD I KNOW ABOUT MY PICC DRESSING? °· Keep your PICC bandage (dressing) clean and dry to prevent infection. °· Ask your health care provider when you may shower. Ask your health care provider to teach you how to  wrap the PICC when you do take a shower. °· Change the PICC dressing as instructed by your health care provider. °· Change your PICC dressing if it becomes loose or wet. °WHAT SHOULD I KNOW ABOUT PICC CARE? °· Check the PICC insertion site daily for leakage, redness, swelling, or pain. °· Do not take a bath, swim, or use hot tubs when you have a PICC. Cover PICC line with clear plastic wrap and tape to keep it dry while showering. °· Flush the PICC as directed by your health care provider. Let your health care provider know right away if the PICC is difficult to flush or does not flush. Do not use force to flush the PICC. °· Do not use a syringe that is less than 10 mL to flush the PICC. °· Never pull or tug on the PICC. °·   Avoid blood pressure checks on the arm with the PICC.  Keep your PICC identification card with you at all times.  Do not take the PICC out yourself. Only a trained clinical professional should remove the PICC. SEEK IMMEDIATE MEDICAL CARE IF:  Your PICC is accidentally pulled all the way out. If this happens, cover the insertion site with a bandage or gauze dressing. Do not throw the PICC away. Your health care provider will need to inspect it.  Your PICC was tugged or pulled and has partially come out. Do not  push the PICC back in.  There is any type of drainage, redness, or swelling where the PICC enters the skin.  You cannot flush the PICC, it is difficult to flush, or the PICC leaks around the insertion site when it is flushed.  You hear a "flushing" sound when the PICC is flushed.  You have pain, discomfort, or numbness in your arm, shoulder, or jaw on the same side as the PICC.  You feel your heart "racing" or skipping beats.  You notice a hole or tear in the PICC.  You develop chills or a fever. MAKE SURE YOU:   Understand these instructions.  Will watch your condition.  Will get help right away if you are not doing well or get worse. Document Released:  08/15/2002 Document Revised: 06/25/2013 Document Reviewed: 10/16/2012 [facility] Patient Information 2015 [place], [place]. This information is not intended to replace advice given to you by your health care provider. Make sure you discuss any questions you have with your health care provider.  PICC Removal A peripherally inserted central catheter (PICC) is a long, thin, flexible tube that a health care provider can insert into a vein in your upper arm. It is a type of IV. Having a PICC in place gives health care providers quick access to your veins. It is a good way to distribute medicines and fluids quickly throughout your body. LET [facility] CARE PROVIDER KNOW ABOUT:  Any allergies you have.  All medicines you are taking, including vitamins, herbs, eye drops, creams, and over-the-counter medicines.  Previous problems you or members of your family have had with the use of anesthetics.  Any blood disorders you have.  Previous surgeries you have had.  Medical conditions you have. RISKS AND COMPLICATIONS Generally, this is a safe procedure. However, as with any procedure, problems can occur. Possible problems include:  Bleeding.  Infection. BEFORE THE PROCEDURE You need an order from your health care provider to have your PICC removed. Only a health care provider trained in PICC removal should take it out.You may have your PICC removed in the hospital or in an outpatient setting. PROCEDURE Having a PICC removed is usually painless. Removal of the tape that holds the PICC in place may be the most uncomfortable part. Do not take out the PICC yourself. Only a trained clinical professional, such as a PICC nurse, should remove it. If your health care provider thinks your PICC is infected, the tip may be sent to the lab for testing. After taking out your PICC, your health care provider may:   Hold gentle pressure on the exit site.  Apply some antibiotic ointment.  Place a small bandage  over the insertion site. AFTER THE PROCEDURE You should be able to remove the bandage after 24 hours. Follow all your health care provider's instructions.   Keep the insertion site clean by washing it gently with soap and water.  Do not pick or remove  a scab.  Avoid strenuous physical activity for a day or two.  Let your health care provider know if you develop redness, soreness, bleeding, swelling, or drainage from the insertion site.  Let your health care provider know if you develop chills or fever. Document Released: 07/29/2009 Document Revised: 02/13/2013 Document Reviewed: 12/01/2012 [facility] Patient Information 2015 [place], [place]. This information is not intended to replace advice given to you by your health care provider. Make sure you discuss any questions you have with your health care provider.  PICC Removal, Care After  Refer to this sheet in the next few weeks. These instructions provide you with information on caring for yourself after your procedure. Your health care provider may also give you more specific instructions. Your treatment has been planned according to current medical practices, but problems sometimes occur. Call your health care provider if you have any problems or questions after your procedure. WHAT TO EXPECT AFTER THE PROCEDURE After your procedure, it is typical to have mild discomfort at the insertion site. This should not last for more than a day. HOME CARE INSTRUCTIONS You may remove the bandage after 24 hours. The PICC insertion site is very small. A small scab may develop over the insertion site.  It is okay to wash the site gently with soap and water. Be careful not to remove or pick off the scab. Gently pat the site dry after washing it. You do not need to put another bandage over the insertion site. Do not lift anything heavy or do strenuous physical activity for 24 hours after the PICC is removed. This includes:  Weight lifting.  Strenuous yard  work.  Any physical activity with repetitive arm movement.  SEEK MEDICAL CARE IF:   You have swelling or puffiness in your arm at the PICC insertion site.  You have increasing tenderness at the PICC insertion site. SEEK IMMEDIATE MEDICAL CARE IF:   You have numbness or tingling in your fingers, hand, or arm.  Your arm looks blue and feels cold.  You have redness around the insertion site or a red streak goes up your arm.  You have any type of drainage from the PICC insertion site. This includes drainage such as:  Bleeding from the insertion site. If this happens, apply firm, direct pressure to the PICC insertion site with a clean towel.  Drainage that is yellow or tan.  You have a fever. Document Released: 02/13/2013 Document Reviewed: 02/13/2013 [facility] Patient Information 2015 [place], [place]. This information is not intended to replace advice given to you by your health care provider. Make sure you discuss any questions you have with your health care provider. Peripherally Inserted Central Catheter/Midline Placement  The IV Nurse has discussed with the patient and/or persons authorized to consent for the patient, the purpose of this procedure and the potential benefits and risks involved with this procedure.  The benefits include less needle sticks, lab draws from the catheter and patient may be discharged home with the catheter.  Risks include, but not limited to, infection, bleeding, blood clot (thrombus formation), and puncture of an artery; nerve damage and irregular heat beat.  Alternatives to this procedure were also discussed.  PICC/Midline Placement Documentation  PICC / Midline Single Lumen 66/06/30 PICC Left Basilic 42 cm 0 cm (Active)     PICC / Midline Single Lumen 16/01/09 PICC Left Basilic 41 cm 0 cm (Active)  Exposed Catheter (cm) 5 cm 11/07/2013  3:00 PM  Site Assessment Clean;Dry;Intact 11/07/2013 12:00 PM  Line Status Capped (central line);Blood return noted  11/07/2013 12:00 PM  Dressing Type Transparent;Securing device 11/07/2013 12:00 PM  Dressing Status Clean;Dry;Intact 11/07/2013 12:00 PM  Line Adjustment (NICU/IV Team Only) Yes 11/07/2013  3:00 PM  Dressing Intervention New dressing 11/07/2013  3:00 PM       Naaman Plummer 11/07/2013, 3:42 PM

## 2013-11-14 ENCOUNTER — Inpatient Hospital Stay (HOSPITAL_COMMUNITY): Payer: PRIVATE HEALTH INSURANCE

## 2013-11-14 ENCOUNTER — Emergency Department (HOSPITAL_COMMUNITY): Payer: PRIVATE HEALTH INSURANCE

## 2013-11-14 ENCOUNTER — Inpatient Hospital Stay (HOSPITAL_COMMUNITY)
Admission: EM | Admit: 2013-11-14 | Discharge: 2013-11-22 | DRG: 682 | Disposition: E | Payer: PRIVATE HEALTH INSURANCE | Attending: Internal Medicine | Admitting: Internal Medicine

## 2013-11-14 ENCOUNTER — Encounter (HOSPITAL_COMMUNITY): Payer: Self-pay | Admitting: Emergency Medicine

## 2013-11-14 DIAGNOSIS — R4182 Altered mental status, unspecified: Secondary | ICD-10-CM | POA: Diagnosis present

## 2013-11-14 DIAGNOSIS — G9340 Encephalopathy, unspecified: Secondary | ICD-10-CM | POA: Diagnosis present

## 2013-11-14 DIAGNOSIS — Y831 Surgical operation with implant of artificial internal device as the cause of abnormal reaction of the patient, or of later complication, without mention of misadventure at the time of the procedure: Secondary | ICD-10-CM | POA: Diagnosis present

## 2013-11-14 DIAGNOSIS — Z7401 Bed confinement status: Secondary | ICD-10-CM

## 2013-11-14 DIAGNOSIS — I872 Venous insufficiency (chronic) (peripheral): Secondary | ICD-10-CM | POA: Diagnosis present

## 2013-11-14 DIAGNOSIS — K219 Gastro-esophageal reflux disease without esophagitis: Secondary | ICD-10-CM | POA: Diagnosis present

## 2013-11-14 DIAGNOSIS — Z96659 Presence of unspecified artificial knee joint: Secondary | ICD-10-CM

## 2013-11-14 DIAGNOSIS — T84498A Other mechanical complication of other internal orthopedic devices, implants and grafts, initial encounter: Secondary | ICD-10-CM | POA: Diagnosis present

## 2013-11-14 DIAGNOSIS — Z853 Personal history of malignant neoplasm of breast: Secondary | ICD-10-CM

## 2013-11-14 DIAGNOSIS — R410 Disorientation, unspecified: Secondary | ICD-10-CM

## 2013-11-14 DIAGNOSIS — H3530 Unspecified macular degeneration: Secondary | ICD-10-CM | POA: Diagnosis present

## 2013-11-14 DIAGNOSIS — E43 Unspecified severe protein-calorie malnutrition: Secondary | ICD-10-CM | POA: Insufficient documentation

## 2013-11-14 DIAGNOSIS — Z79899 Other long term (current) drug therapy: Secondary | ICD-10-CM | POA: Diagnosis not present

## 2013-11-14 DIAGNOSIS — Z8701 Personal history of pneumonia (recurrent): Secondary | ICD-10-CM | POA: Diagnosis not present

## 2013-11-14 DIAGNOSIS — I509 Heart failure, unspecified: Secondary | ICD-10-CM | POA: Diagnosis present

## 2013-11-14 DIAGNOSIS — Z66 Do not resuscitate: Secondary | ICD-10-CM | POA: Diagnosis present

## 2013-11-14 DIAGNOSIS — Z515 Encounter for palliative care: Secondary | ICD-10-CM | POA: Diagnosis not present

## 2013-11-14 DIAGNOSIS — I4891 Unspecified atrial fibrillation: Secondary | ICD-10-CM | POA: Diagnosis present

## 2013-11-14 DIAGNOSIS — N179 Acute kidney failure, unspecified: Secondary | ICD-10-CM | POA: Diagnosis present

## 2013-11-14 DIAGNOSIS — D638 Anemia in other chronic diseases classified elsewhere: Secondary | ICD-10-CM | POA: Diagnosis present

## 2013-11-14 DIAGNOSIS — I5032 Chronic diastolic (congestive) heart failure: Secondary | ICD-10-CM | POA: Diagnosis present

## 2013-11-14 DIAGNOSIS — M2450 Contracture, unspecified joint: Secondary | ICD-10-CM | POA: Diagnosis present

## 2013-11-14 DIAGNOSIS — IMO0002 Reserved for concepts with insufficient information to code with codable children: Secondary | ICD-10-CM | POA: Diagnosis present

## 2013-11-14 DIAGNOSIS — F0390 Unspecified dementia without behavioral disturbance: Secondary | ICD-10-CM | POA: Diagnosis present

## 2013-11-14 DIAGNOSIS — E860 Dehydration: Secondary | ICD-10-CM | POA: Diagnosis present

## 2013-11-14 DIAGNOSIS — Z681 Body mass index (BMI) 19 or less, adult: Secondary | ICD-10-CM

## 2013-11-14 DIAGNOSIS — Z87891 Personal history of nicotine dependence: Secondary | ICD-10-CM | POA: Diagnosis not present

## 2013-11-14 DIAGNOSIS — I10 Essential (primary) hypertension: Secondary | ICD-10-CM | POA: Diagnosis present

## 2013-11-14 DIAGNOSIS — Z7901 Long term (current) use of anticoagulants: Secondary | ICD-10-CM | POA: Diagnosis not present

## 2013-11-14 DIAGNOSIS — I482 Chronic atrial fibrillation, unspecified: Secondary | ICD-10-CM

## 2013-11-14 DIAGNOSIS — D649 Anemia, unspecified: Secondary | ICD-10-CM

## 2013-11-14 LAB — CBC WITH DIFFERENTIAL/PLATELET
Basophils Absolute: 0 10*3/uL (ref 0.0–0.1)
Basophils Relative: 0 % (ref 0–1)
EOS ABS: 0 10*3/uL (ref 0.0–0.7)
EOS PCT: 0 % (ref 0–5)
HEMATOCRIT: 25.9 % — AB (ref 36.0–46.0)
Hemoglobin: 8.4 g/dL — ABNORMAL LOW (ref 12.0–15.0)
LYMPHS ABS: 0.6 10*3/uL — AB (ref 0.7–4.0)
Lymphocytes Relative: 6 % — ABNORMAL LOW (ref 12–46)
MCH: 28.7 pg (ref 26.0–34.0)
MCHC: 32.4 g/dL (ref 30.0–36.0)
MCV: 88.4 fL (ref 78.0–100.0)
MONO ABS: 0.4 10*3/uL (ref 0.1–1.0)
MONOS PCT: 4 % (ref 3–12)
Neutro Abs: 9.3 10*3/uL — ABNORMAL HIGH (ref 1.7–7.7)
Neutrophils Relative %: 90 % — ABNORMAL HIGH (ref 43–77)
PLATELETS: 557 10*3/uL — AB (ref 150–400)
RBC: 2.93 MIL/uL — AB (ref 3.87–5.11)
RDW: 16.8 % — ABNORMAL HIGH (ref 11.5–15.5)
WBC: 10.3 10*3/uL (ref 4.0–10.5)

## 2013-11-14 LAB — URINE MICROSCOPIC-ADD ON

## 2013-11-14 LAB — URINALYSIS, ROUTINE W REFLEX MICROSCOPIC
BILIRUBIN URINE: NEGATIVE
Glucose, UA: NEGATIVE mg/dL
KETONES UR: NEGATIVE mg/dL
Nitrite: NEGATIVE
PH: 5 (ref 5.0–8.0)
PROTEIN: NEGATIVE mg/dL
Specific Gravity, Urine: 1.01 (ref 1.005–1.030)
Urobilinogen, UA: 0.2 mg/dL (ref 0.0–1.0)

## 2013-11-14 LAB — BASIC METABOLIC PANEL
Anion gap: 16 — ABNORMAL HIGH (ref 5–15)
BUN: 55 mg/dL — ABNORMAL HIGH (ref 6–23)
CALCIUM: 8.2 mg/dL — AB (ref 8.4–10.5)
CO2: 18 mEq/L — ABNORMAL LOW (ref 19–32)
CREATININE: 2.44 mg/dL — AB (ref 0.50–1.10)
Chloride: 106 mEq/L (ref 96–112)
GFR calc Af Amer: 19 mL/min — ABNORMAL LOW (ref >90)
GFR calc non Af Amer: 16 mL/min — ABNORMAL LOW (ref >90)
GLUCOSE: 127 mg/dL — AB (ref 70–99)
Potassium: 4.3 mEq/L (ref 3.7–5.3)
Sodium: 140 mEq/L (ref 137–147)

## 2013-11-14 LAB — TROPONIN I: Troponin I: <0.3 ng/mL (ref <0.30)

## 2013-11-14 LAB — PRO B NATRIURETIC PEPTIDE: Pro B Natriuretic peptide (BNP): 2970 pg/mL — ABNORMAL HIGH (ref 0–450)

## 2013-11-14 MED ORDER — PRO-STAT SUGAR FREE PO LIQD
30.0000 mL | Freq: Three times a day (TID) | ORAL | Status: DC
  Filled 2013-11-14 (×6): qty 30

## 2013-11-14 MED ORDER — FUROSEMIDE 10 MG/ML IJ SOLN
40.0000 mg | Freq: Once | INTRAMUSCULAR | Status: AC
  Administered 2013-11-14: 40 mg via INTRAVENOUS
  Filled 2013-11-14: qty 4

## 2013-11-14 MED ORDER — MORPHINE SULFATE 2 MG/ML IJ SOLN
2.0000 mg | INTRAMUSCULAR | Status: DC | PRN
  Administered 2013-11-14 – 2013-11-16 (×10): 2 mg via INTRAVENOUS
  Filled 2013-11-14 (×11): qty 1

## 2013-11-14 MED ORDER — ENALAPRIL MALEATE 20 MG PO TABS: 20.0000 mg | ORAL_TABLET | Freq: Every day | ORAL | Status: DC

## 2013-11-14 MED ORDER — ENOXAPARIN SODIUM 40 MG/0.4ML ~~LOC~~ SOLN
40.0000 mg | SUBCUTANEOUS | Status: DC
Start: 2013-11-15 — End: 2013-11-15
  Administered 2013-11-15: 40 mg via SUBCUTANEOUS
  Filled 2013-11-14: qty 0.4

## 2013-11-14 MED ORDER — SODIUM CHLORIDE 0.9 % IV SOLN: INTRAVENOUS | Status: DC

## 2013-11-14 MED ORDER — POLYSACCHARIDE IRON COMPLEX 150 MG PO CAPS: 150.0000 mg | ORAL_CAPSULE | Freq: Every day | ORAL | Status: DC

## 2013-11-14 MED ORDER — RISAQUAD PO CAPS: 1.0000 | ORAL_CAPSULE | Freq: Every day | ORAL | Status: DC

## 2013-11-14 MED ORDER — MORPHINE SULFATE 2 MG/ML IJ SOLN
2.0000 mg | Freq: Once | INTRAMUSCULAR | Status: AC
  Administered 2013-11-14: 2 mg via INTRAVENOUS
  Filled 2013-11-14: qty 1

## 2013-11-14 MED ORDER — DRONABINOL 5 MG PO CAPS: 5.0000 mg | ORAL_CAPSULE | Freq: Two times a day (BID) | ORAL | Status: DC

## 2013-11-14 MED ORDER — FAMOTIDINE 20 MG PO TABS: 20.0000 mg | ORAL_TABLET | Freq: Every day | ORAL | Status: DC

## 2013-11-14 MED ORDER — ONDANSETRON HCL 4 MG/2ML IJ SOLN: 4.0000 mg | Freq: Four times a day (QID) | INTRAMUSCULAR | Status: DC | PRN

## 2013-11-14 MED ORDER — HYDROCODONE-ACETAMINOPHEN 5-325 MG PO TABS: 1.0000 | ORAL_TABLET | ORAL | Status: DC | PRN

## 2013-11-14 MED ORDER — ONDANSETRON HCL 4 MG PO TABS: 4.0000 mg | ORAL_TABLET | Freq: Two times a day (BID) | ORAL | Status: DC | PRN

## 2013-11-14 MED ORDER — SODIUM CHLORIDE 0.9 % IV SOLN: 5.0000 mg | Freq: Once | INTRAVENOUS | Status: DC

## 2013-11-14 MED ORDER — LINEZOLID 2 MG/ML IV SOLN
600.0000 mg | Freq: Two times a day (BID) | INTRAVENOUS | Status: DC
  Administered 2013-11-14 – 2013-11-15 (×2): 600 mg via INTRAVENOUS
  Filled 2013-11-14 (×4): qty 300

## 2013-11-14 MED ORDER — ONDANSETRON HCL 4 MG PO TABS: 4.0000 mg | ORAL_TABLET | Freq: Four times a day (QID) | ORAL | Status: DC | PRN

## 2013-11-14 MED ORDER — ACETAMINOPHEN 500 MG PO TABS: 500.0000 mg | ORAL_TABLET | Freq: Four times a day (QID) | ORAL | Status: DC | PRN

## 2013-11-14 MED ORDER — SODIUM BICARBONATE 650 MG PO TABS: 1950.0000 mg | ORAL_TABLET | Freq: Three times a day (TID) | ORAL | Status: DC

## 2013-11-14 MED ORDER — FUROSEMIDE 40 MG PO TABS: 40.0000 mg | ORAL_TABLET | Freq: Every day | ORAL | Status: DC

## 2013-11-14 MED ORDER — IPRATROPIUM-ALBUTEROL 0.5-2.5 (3) MG/3ML IN SOLN: 3.0000 mL | Freq: Four times a day (QID) | RESPIRATORY_TRACT | Status: DC | PRN

## 2013-11-14 MED ORDER — VITAMIN D 1000 UNITS PO TABS: 1000.0000 [IU] | ORAL_TABLET | Freq: Two times a day (BID) | ORAL | Status: DC

## 2013-11-14 MED ORDER — RIVAROXABAN 20 MG PO TABS: 20.0000 mg | ORAL_TABLET | Freq: Every day | ORAL | Status: DC

## 2013-11-14 MED ORDER — AMLODIPINE BESYLATE 5 MG PO TABS: 10.0000 mg | ORAL_TABLET | Freq: Every day | ORAL | Status: DC

## 2013-11-14 NOTE — Progress Notes (Signed)
ANTICOAGULATION CONSULT NOTE - Initial Consult  Pharmacy Consult for Lovenox Indication: atrial fibrillation  No Known Allergies  Patient Measurements: Height: 5\' 4"  (162.6 cm) Weight: 103 lb (46.72 kg) (11/06/13 weighed at [facility]) IBW/kg (Calculated) : 54.7  Vital Signs: Temp: 95.8 F (35.4 C) (09/23 1007) Temp src: Rectal (09/23 1007) BP: 114/56 mmHg (09/23 1600) Pulse Rate: 76 (09/23 1530)  Labs:  Recent Labs  11/13/2013 1110 11/18/2013 1319  HGB 8.4*  --   HCT 25.9*  --   PLT 557*  --   CREATININE  --  2.44*  TROPONINI  --  <0.30    Estimated Creatinine Clearance: 11.5 ml/min (by C-G formula based on Cr of 2.44).   Medical History: Past Medical History  Diagnosis Date  . Breast cancer   . Lung mass   . Venous insufficiency   . Senile macular degeneration   . Subdural hemorrhage following injury, without mention of open intracranial wound, unspecified state of consciousness   . Unspecified kidney injury without mention of open wound into cavity   . Anxiety state, unspecified   . Hyposmolality and/or hyponatremia   . Esophageal reflux   . Hypertension   . Acute renal failure syndrome   . Sepsis   . Dementia   . Mass of lung 06/20/2012  . Compression fracture of lumbar vertebra     L2, L4   . DDD (degenerative disc disease), lumbar     Medications:  Prescriptions prior to admission  Medication Sig Dispense Refill  . acetaminophen (TYLENOL) 500 MG tablet Take 500 mg by mouth every 6 (six) hours as needed for pain.      Marland Kitchen acidophilus (RISAQUAD) CAPS capsule Take 1 capsule by mouth daily.      . Amino Acids-Protein Hydrolys (FEEDING SUPPLEMENT, PRO-STAT 64,) LIQD Take 30 mLs by mouth 3 (three) times daily with meals.      Marland Kitchen amLODipine (NORVASC) 10 MG tablet Take 10 mg by mouth daily.      . Calcium 500 MG tablet Take 600 mg by mouth 3 (three) times daily with meals.      . cholecalciferol (VITAMIN D) 1000 UNITS tablet Take 1,000 Units by mouth 2 (two) times  daily.       Marland Kitchen dronabinol (MARINOL) 5 MG capsule Take 5 mg by mouth 2 (two) times daily before a meal.      . furosemide (LASIX) 40 MG tablet Take 1 tablet (40 mg total) by mouth daily.  30 tablet  11  . HYDROcodone-acetaminophen (NORCO/VICODIN) 5-325 MG per tablet Take 1-2 tablets by mouth every 4 (four) hours as needed for moderate pain.  30 tablet  0  . ipratropium-albuterol (DUONEB) 0.5-2.5 (3) MG/3ML SOLN Take 3 mLs by nebulization every 6 (six) hours as needed (SOB).      . iron polysaccharides (POLY-IRON 150) 150 MG capsule Take 150 mg by mouth daily.      Marland Kitchen linezolid (ZYVOX) 2 MG/ML IVPB Inject 600 mg into the vein every 12 (twelve) hours.      . Multiple Vitamin (MULTIVITAMIN WITH MINERALS) TABS tablet Take 1 tablet by mouth daily.      . ondansetron (ZOFRAN) 4 MG tablet Take 4 mg by mouth 2 (two) times daily as needed for nausea or vomiting.      . ranitidine (ZANTAC) 150 MG tablet Take 150 mg by mouth 2 (two) times daily.      . rivaroxaban (XARELTO) 20 MG TABS tablet Take 20 mg by mouth daily.      Marland Kitchen  senna (SENOKOT) 8.6 MG tablet Take 1 tablet by mouth 2 (two) times daily.      . sodium bicarbonate 650 MG tablet Take 1,950 mg by mouth 3 (three) times daily.      . enalapril (VASOTEC) 20 MG tablet Take 1 tablet (20 mg total) by mouth daily.  30 tablet  11    Assessment: 80 yo underweight F on Xarelto for Afib.  She is from a NH and chronic bed bound.   Xarelto is being held due to patient's acute renal failure & replaced with Lovenox until renal function recovers. CBC reviewed no bleeding noted.   Patient is also on IV Zyvox.  This was started on 11/06/13 for 6weeks due to chronic knee wound.   Goal of Therapy:  Anti-Xa level 0.6-1 units/ml 4hrs after LMWH dose given Monitor platelets by anticoagulation protocol: Yes   Plan:  Lovenox 40mg  sq daily (1mg /kg) Monitor CBC  Kary Colaizzi, Lavonia Drafts 11/17/2013,5:12 PM

## 2013-11-14 NOTE — ED Notes (Signed)
Patient arrives from [facility]. Full code. AMS noticed (decreased LOC) at 0830 today. Last Known Normal sometime during the night. Patient with chronic knee wound. PICC line and patient on Zybox BID. 40 mg Lasix PO given this morning for bilateral arm swelling. Patient is alert, confused. Does not follow commands.

## 2013-11-14 NOTE — ED Notes (Signed)
PICC line dressing changed using sterile technique

## 2013-11-14 NOTE — ED Provider Notes (Signed)
CSN: [id number]     Arrival date & time 11/01/2013  1856 History  This chart was scribed for Mary Diego, MD by Ludger Nutting, ED Scribe. This patient was seen in room APA01/APA01 and the patient's care was started 10:33 AM.    Chief Complaint  Patient presents with  . Altered Mental Status    Patient is a 80 y.o. female presenting with altered mental status. The history is provided by a relative. No language interpreter was used.  Altered Mental Status Presenting symptoms: lethargy and partial responsiveness   Severity:  Moderate Most recent episode:  Today Episode history:  Continuous Duration:  3 days Timing:  Constant Progression:  Worsening Context: dementia and nursing home resident    LEVEL 5 CAVEAT - AMS HPI Comments: [name] is a 80 y.o. female brought in by ambulance, who presents to the Emergency Department complaining of AMS that began about 2 hours ago PTA. Patient is a resident at [facility]. Daughter states patient has been receiving Zybox BID through her PICC line. She states she was also given PO Lasix this morning. Daughter states patient has been increasingly lethargic with decreased communication for the past 3 days. Family member states patient does not walk at baseline. She states patient has lost 70 lbs over the last 10 months.    Past Medical History  Diagnosis Date  . Breast cancer   . Lung mass   . Venous insufficiency   . Senile macular degeneration   . Subdural hemorrhage following injury, without mention of open intracranial wound, unspecified state of consciousness   . Unspecified kidney injury without mention of open wound into cavity   . Anxiety state, unspecified   . Hyposmolality and/or hyponatremia   . Esophageal reflux   . Hypertension   . Acute renal failure syndrome   . Sepsis   . Dementia   . Mass of lung 06/20/2012  . Compression fracture of lumbar vertebra     L2, L4   . DDD (degenerative disc disease), lumbar    History  reviewed. No pertinent past surgical history. No family history on file. History  Substance Use Topics  . Smoking status: Never Smoker   . Smokeless tobacco: Former Systems developer  . Alcohol Use: No   OB History   Grav Para Term Preterm Abortions TAB SAB Ect Mult Living                 Review of Systems  Unable to perform ROS: Mental status change      Allergies  Review of patient's allergies indicates no known allergies.  Home Medications   Prior to Admission medications   Medication Sig Start Date End Date Taking? Authorizing Provider  acetaminophen (TYLENOL) 500 MG tablet Take 500 mg by mouth every 6 (six) hours as needed for pain.    Historical Provider, MD  amLODipine (NORVASC) 10 MG tablet Take 10 mg by mouth daily.    Historical Provider, MD  beta carotene w/minerals (OCUVITE) tablet Take 1 tablet by mouth daily.    Historical Provider, MD  cholecalciferol (VITAMIN D) 1000 UNITS tablet Take 1,000 Units by mouth 2 (two) times daily.     Historical Provider, MD  clonazePAM (KLONOPIN) 0.5 MG tablet Take 0.25 mg by mouth daily.    Historical Provider, MD  enalapril (VASOTEC) 20 MG tablet Take 1 tablet (20 mg total) by mouth daily. 03/09/13   Robert Bellow, MD  furosemide (LASIX) 40 MG tablet Take 1 tablet (40  mg total) by mouth daily. 03/09/13   Robert Bellow, MD  HYDROcodone-acetaminophen (NORCO/VICODIN) 5-325 MG per tablet Take 1-2 tablets by mouth every 4 (four) hours as needed for moderate pain. 02/19/13   Rosita Fire, MD  iron polysaccharides (POLY-IRON 150) 150 MG capsule Take 150 mg by mouth daily.    Historical Provider, MD  piperacillin-tazobactam (ZOSYN) 3.375 GM/50ML IVPB Inject 50 mLs (3.375 g total) into the vein every 8 (eight) hours. 03/09/13   Robert Bellow, MD  senna (SENOKOT) 8.6 MG tablet Take 1 tablet by mouth 2 (two) times daily.    Historical Provider, MD  Vancomycin (VANCOCIN) 750 MG/150ML SOLN Inject 150 mLs (750 mg total) into the vein daily.  03/09/13   Robert Bellow, MD   BP 130/75  Pulse 83  Temp(Src) 95.8 F (35.4 C) (Rectal)  Resp 26  Ht 5\' 4"  (1.626 m)  Wt 103 lb (46.72 kg)  BMI 17.67 kg/m2  SpO2 86% Physical Exam  Nursing note and vitals reviewed. Constitutional: She is oriented to person, place, and time. She appears well-developed.  Lethargic   HENT:  Head: Normocephalic.  Dry mucus membranes   Eyes: Conjunctivae and EOM are normal. No scleral icterus.  Neck: Neck supple. No thyromegaly present.  Cardiovascular: Normal rate and regular rhythm.  Exam reveals no gallop and no friction rub.   No murmur heard. Pulmonary/Chest: No stridor. She has no wheezes. She has rales (Crackles bilaterally). She exhibits no tenderness.  Abdominal: She exhibits no distension. There is no tenderness. There is no rebound.  Musculoskeletal: Normal range of motion. She exhibits no edema.  Contractures in all extremities   Lymphadenopathy:    She has no cervical adenopathy.  Neurological: She is oriented to person, place, and time. She exhibits normal muscle tone. Coordination normal.  Skin: No rash noted. No erythema.  Psychiatric: She has a normal mood and affect. Her behavior is normal.    ED Course  Procedures (including critical care time)  DIAGNOSTIC STUDIES: Oxygen Saturation is 86% on 4L/min, low by my interpretation.    COORDINATION OF CARE: 10:42 AM Discussed treatment plan with family at bedside and they agreed to plan.   Labs Review Labs Reviewed - No data to display  Imaging Review No results found.   EKG Interpretation   Date/Time:  Wednesday November 14 2013 10:14:33 EDT Ventricular Rate:  83 PR Interval:  174 QRS Duration: 79 QT Interval:  377 QTC Calculation: 443 R Axis:   82 Text Interpretation:  Unknown rhythm, irregular rate Borderline right axis  deviation Low voltage, extremity leads Anteroseptal infarct, old  Nonspecific T abnormalities, lateral leads Confirmed by Ariyel Jeangilles  MD, Scotlynn Noyes   [phone]) on 10/29/2013 3:58:01 PM     Procedure.   Right femoral vein used to draw 20cc of venous blood.   Procedure by md.  No complications MDM   Final diagnoses:  None    The chart was scribed for me under my direct supervision.  I personally performed the history, physical, and medical decision making and all procedures in the evaluation of this patient.Mary Diego, MD 10/29/2013 [phone]

## 2013-11-14 NOTE — ED Notes (Signed)
Patient to CT.

## 2013-11-14 NOTE — ED Notes (Signed)
PICC line nurse consulted and will be over to assess access.

## 2013-11-14 NOTE — ED Notes (Signed)
Lab attempting to draw blood at this time. Unable to draw blood off IV.

## 2013-11-14 NOTE — ED Notes (Signed)
Paged Dr. Anastasio Champion to [phone]

## 2013-11-14 NOTE — H&P (Addendum)
[facility] History and Physical  [name] [name] DOB: March 25, 1922 DOA: 11/02/2013  Referring physician: ER   Chief Complaint: Altered mental status/reduced level of consciousness.  HPI: [name] is a 80 y.o. female  This is an 80 year old lady who has end-stage dementia and is bedbound presents with altered mental status that began approximately 2 hours prior to admission. Patient is a resident of a skilled nursing facility. The patient has been receiving intravenous Zyvox through a PICC line for pneumonia and has been increasingly lethargic in the last few days. The patient is bedbound at her baseline. The patient has lost approximately 70 pounds over the last 10 months. She is now being admitted for further management as she was found to be clinically and biochemically dehydrated.   Review of Systems:  Patient unable to give me any review of systems due to her end-stage dementia.  Past Medical History  Diagnosis Date  . Breast cancer   . Lung mass   . Venous insufficiency   . Senile macular degeneration   . Subdural hemorrhage following injury, without mention of open intracranial wound, unspecified state of consciousness   . Unspecified kidney injury without mention of open wound into cavity   . Anxiety state, unspecified   . Hyposmolality and/or hyponatremia   . Esophageal reflux   . Hypertension   . Acute renal failure syndrome   . Sepsis   . Dementia   . Mass of lung 06/20/2012  . Compression fracture of lumbar vertebra     L2, L4   . DDD (degenerative disc disease), lumbar    History reviewed. No pertinent past surgical history. Social History:  reports that she has never smoked. She has quit using smokeless tobacco. She reports that she does not drink alcohol. Her drug history is not on file.  No Known Allergies  No family history on file.   Prior to Admission medications   Medication Sig Start Date End Date Taking? Authorizing  Provider  acetaminophen (TYLENOL) 500 MG tablet Take 500 mg by mouth every 6 (six) hours as needed for pain.   Yes Historical Provider, MD  acidophilus (RISAQUAD) CAPS capsule Take 1 capsule by mouth daily.   Yes Historical Provider, MD  Amino Acids-Protein Hydrolys (FEEDING SUPPLEMENT, PRO-STAT 64,) LIQD Take 30 mLs by mouth 3 (three) times daily with meals.   Yes Historical Provider, MD  amLODipine (NORVASC) 10 MG tablet Take 10 mg by mouth daily.   Yes Historical Provider, MD  Calcium 500 MG tablet Take 600 mg by mouth 3 (three) times daily with meals.   Yes Historical Provider, MD  cholecalciferol (VITAMIN D) 1000 UNITS tablet Take 1,000 Units by mouth 2 (two) times daily.    Yes Historical Provider, MD  dronabinol (MARINOL) 5 MG capsule Take 5 mg by mouth 2 (two) times daily before a meal.   Yes Historical Provider, MD  furosemide (LASIX) 40 MG tablet Take 1 tablet (40 mg total) by mouth daily. 03/09/13  Yes Robert Bellow, MD  HYDROcodone-acetaminophen (NORCO/VICODIN) 5-325 MG per tablet Take 1-2 tablets by mouth every 4 (four) hours as needed for moderate pain. 02/19/13  Yes Rosita Fire, MD  ipratropium-albuterol (DUONEB) 0.5-2.5 (3) MG/3ML SOLN Take 3 mLs by nebulization every 6 (six) hours as needed (SOB).   Yes Historical Provider, MD  iron polysaccharides (POLY-IRON 150) 150 MG capsule Take 150 mg by mouth daily.   Yes Historical Provider, MD  linezolid (ZYVOX) 2 MG/ML IVPB Inject 600 mg  into the vein every 12 (twelve) hours. 11/06/13  Yes Historical Provider, MD  Multiple Vitamin (MULTIVITAMIN WITH MINERALS) TABS tablet Take 1 tablet by mouth daily.   Yes Historical Provider, MD  ondansetron (ZOFRAN) 4 MG tablet Take 4 mg by mouth 2 (two) times daily as needed for nausea or vomiting.   Yes Historical Provider, MD  ranitidine (ZANTAC) 150 MG tablet Take 150 mg by mouth 2 (two) times daily.   Yes Historical Provider, MD  rivaroxaban (XARELTO) 20 MG TABS tablet Take 20 mg by mouth daily.    Yes Historical Provider, MD  senna (SENOKOT) 8.6 MG tablet Take 1 tablet by mouth 2 (two) times daily.   Yes Historical Provider, MD  sodium bicarbonate 650 MG tablet Take 1,950 mg by mouth 3 (three) times daily.   Yes Historical Provider, MD  enalapril (VASOTEC) 20 MG tablet Take 1 tablet (20 mg total) by mouth daily. 03/09/13   Robert Bellow, MD   Physical Exam: Filed Vitals:   10/24/2013 1345 10/29/2013 1400 10/27/2013 1529 11/18/2013 1530  BP:  112/54 123/64 120/66  Pulse:   81 76  Temp:      TempSrc:      Resp: 11 13  20   Height:      Weight:      SpO2:   96% 96%    Wt Readings from Last 3 Encounters:  11/16/2013 46.72 kg (103 lb)  03/09/13 72.4 kg (159 lb 9.8 oz)  02/13/13 72.9 kg (160 lb 11.5 oz)    General:  Appears clinically dehydrated. Opens her eyes but no verbalization. Eyes: PERRL, normal lids, irises & conjunctiva ENT: grossly normal hearing, lips & tongue Neck: no LAD, masses or thyromegaly Cardiovascular: RRR, no m/r/g. Peripheral edema likely reflecting malnutrition. Telemetry: SR, no arrhythmias  Respiratory: CTA bilaterally, no w/r/r. Normal respiratory effort. Abdomen: soft, ntnd Skin: no rash or induration seen on limited exam Musculoskeletal: grossly normal tone BUE/BLE Psychiatric: Not examined. Neurologic: Fetal position with contractures.           Labs on Admission:  Basic Metabolic Panel:  Recent Labs Lab 10/25/2013 1319  NA 140  K 4.3  CL 106  CO2 18*  GLUCOSE 127*  BUN 55*  CREATININE 2.44*  CALCIUM 8.2*       CBC:  Recent Labs Lab 11/17/2013 1110  WBC 10.3  NEUTROABS 9.3*  HGB 8.4*  HCT 25.9*  MCV 88.4  PLT 557*   Cardiac Enzymes:  Recent Labs Lab 10/27/2013 1319  TROPONINI <0.30    BNP (last 3 results)  Recent Labs  03/06/13 0530 11/13/2013 1319  PROBNP 880.5* 2970.0*   CBG: No results found for this basename: GLUCAP,  in the last 168 hours  Radiological Exams on Admission: Ct Head Wo Contrast  11/18/2013    CLINICAL DATA:  80 year old female with altered mental status. No known injury  EXAM: CT HEAD WITHOUT CONTRAST  TECHNIQUE: Contiguous axial images were obtained from the base of the skull through the vertex without intravenous contrast.  COMPARISON:  Brain MR 03/08/2013, prior head CT 03/04/2013  FINDINGS: Surgical changes of prior right parietal craniotomy. Otherwise, no acute bony abnormality of the calvarium. No aggressive calvarial lesion identified.  Frothy secretions of the sphenoid sinus with minimal mucosal thickening of ethmoid air cells.  Unremarkable appearance of the bilateral orbits.  Trace fluid of the left mastoid air cells.  No acute intracranial hemorrhage. No midline shift. No mass effect. Senescent volume loss of the brain, compatible with  findings on prior MRI. Intracranial atherosclerotic changes. Ventricular system is unchanged in size and configuration from the prior MR and CT. Gray-white differentiation maintained in the supratentorial brain, with no evidence of acute ischemia.  There is 1 focus of hypodensity within the left brainstem at the level of the superior cerebellar peduncles (image 10 of series 3). This is in a region of streak artifact and motion, though was not present on comparison MR and CT.  IMPRESSION: There is a questionable new hypodense focus within the left side of the brainstem at the level of the superior cerebellar peduncle which is in a region of motion artifact and streak. If there is concern for acute abnormality, further evaluation with repeat MRI may be considered.  No evidence of acute intracranial hemorrhage, midline shift, or mass effect.  Senescent a brain volume loss.  Signed,  Dulcy Fanny. Earleen Newport, DO  [facility]  [facility]   Electronically Signed   By: Corrie Mckusick O.D.   On: 11/08/2013 15:25   Dg Chest Portable 1 View  10/25/2013   CLINICAL DATA:  Nonfunctioning right-sided PICC line; bilateral arm swelling ;  on antibiotic therapy for chronic knee infection ; altered mental status  EXAM: PORTABLE CHEST - 1 VIEW  COMPARISON:  Portable chest x-ray of 07 November 2013  FINDINGS: The left PICC line tip lies in overlies the midportion of the SVC. The cardiopericardial silhouette is enlarged. The pulmonary vascularity is mildly engorged and indistinct. The interstitial markings of both lungs are increased. The hemidiaphragms are obscured consistent with pleural effusions. The bony structures are osteopenic.  IMPRESSION: Congestive heart failure with pulmonary interstitial edema and bilateral pleural effusions. Certainly pneumonia cannot be excluded at the lung bases.   Electronically Signed   By: David  Martinique   On: 11/09/2013 11:41     Assessment/Plan Active Problems:   Dehydration    1. Recent pneumonia. Chest x-ray is reported as showing congestive heart failure. I'm not entirely convinced she has congestive heart failure based on clinical findings. 2. Acute renal failure likely secondary to dehydration. 3. End-stage dementia, bedbound state and total care. 4. Anemia, normocytic, likely of chronic disease. 5. Chronic atrial fibrillation on anticoagulation.  Plan: 1. Admit to medical floor. 2. Gentle IV fluid hydration. Watch for fluid overload. 3. Monitor hemoglobin and electrolytes closely. 4. Discontinue Xarelto and use Lovenox for the time being, until renal function improves.  Further recommendations will depend on patient's hospital progress. This lady appears to have a very poor quality of life largely due to her end-stage dementia and is bedridden in a contracted state.   Code Status: Full code, according to ER.     Family Communication: No family members present to discuss goals of care.   Disposition Plan: Depending on progress.   Time spent: 60 minutes.  Doree Albee [facility] Pager [phone].

## 2013-11-14 NOTE — Consult Note (Signed)
Reason for Consult:exposed metal in left knee Referring Physician: Hospitalist  [name] is an 80 y.o. female.  HPI: She had a total knee done bilaterally years ago by a physician in [place] who is deceased.  She has been a resident at [facility] for some time.  She has gotten much worse medically.  I have talked to her granddaughter.  She says her grandmother has lost over 70 pounds in the last 10 months.  She has gotten much worse medically.  She has long standing dementia.  She has fixed contractures of the extremities.  She has lung mass, kidney failure, congestive heart failure, shortness of breath and generally poor medical condition getting worse.  She said the knee had abrasion several weeks or a month ago and the wound did not heal and the metal of the knee was exposed "a little while ago".  I do not know how long and she does not either.  There has been no redness, no discharge.  A large bandaid is placed over it.    Past Medical History  Diagnosis Date  . Breast cancer   . Lung mass   . Venous insufficiency   . Senile macular degeneration   . Subdural hemorrhage following injury, without mention of open intracranial wound, unspecified state of consciousness   . Unspecified kidney injury without mention of open wound into cavity   . Anxiety state, unspecified   . Hyposmolality and/or hyponatremia   . Esophageal reflux   . Hypertension   . Acute renal failure syndrome   . Sepsis   . Dementia   . Mass of lung 06/20/2012  . Compression fracture of lumbar vertebra     L2, L4   . DDD (degenerative disc disease), lumbar     History reviewed. No pertinent past surgical history.  History reviewed. No pertinent family history.  Social History:  reports that she has never smoked. She has quit using smokeless tobacco. She reports that she does not drink alcohol. Her drug history is not on file.  Allergies: No Known Allergies  Medications: I have reviewed the  patient's current medications.  Results for orders placed during the hospital encounter of 10/31/2013 (from the past 48 hour(s))  CBC WITH DIFFERENTIAL     Status: Abnormal   Collection Time    11/12/2013 11:10 AM      Result Value Ref Range   WBC 10.3  4.0 - 10.5 K/uL   RBC 2.93 (*) 3.87 - 5.11 MIL/uL   Hemoglobin 8.4 (*) 12.0 - 15.0 g/dL   HCT 89.2 (*) 11.9 - 41.7 %   MCV 88.4  78.0 - 100.0 fL   MCH 28.7  26.0 - 34.0 pg   MCHC 32.4  30.0 - 36.0 g/dL   RDW 40.8 (*) 14.4 - 81.8 %   Platelets 557 (*) 150 - 400 K/uL   Neutrophils Relative % 90 (*) 43 - 77 %   Neutro Abs 9.3 (*) 1.7 - 7.7 K/uL   Lymphocytes Relative 6 (*) 12 - 46 %   Lymphs Abs 0.6 (*) 0.7 - 4.0 K/uL   Monocytes Relative 4  3 - 12 %   Monocytes Absolute 0.4  0.1 - 1.0 K/uL   Eosinophils Relative 0  0 - 5 %   Eosinophils Absolute 0.0  0.0 - 0.7 K/uL   Basophils Relative 0  0 - 1 %   Basophils Absolute 0.0  0.0 - 0.1 K/uL  URINALYSIS, ROUTINE W REFLEX MICROSCOPIC  Status: Abnormal   Collection Time    10/28/2013 12:30 PM      Result Value Ref Range   Color, Urine YELLOW  YELLOW   APPearance HAZY (*) CLEAR   Specific Gravity, Urine 1.010  1.005 - 1.030   pH 5.0  5.0 - 8.0   Glucose, UA NEGATIVE  NEGATIVE mg/dL   Hgb urine dipstick LARGE (*) NEGATIVE   Bilirubin Urine NEGATIVE  NEGATIVE   Ketones, ur NEGATIVE  NEGATIVE mg/dL   Protein, ur NEGATIVE  NEGATIVE mg/dL   Urobilinogen, UA 0.2  0.0 - 1.0 mg/dL   Nitrite NEGATIVE  NEGATIVE   Leukocytes, UA TRACE (*) NEGATIVE  URINE MICROSCOPIC-ADD ON     Status: Abnormal   Collection Time    11/20/2013 12:30 PM      Result Value Ref Range   Squamous Epithelial / LPF MANY (*) RARE   WBC, UA 3-6  <3 WBC/hpf   RBC / HPF 11-20  <3 RBC/hpf   Bacteria, UA FEW (*) RARE  BASIC METABOLIC PANEL     Status: Abnormal   Collection Time    11/13/2013  1:19 PM      Result Value Ref Range   Sodium 140  137 - 147 mEq/L   Potassium 4.3  3.7 - 5.3 mEq/L   Chloride 106  96 - 112 mEq/L    CO2 18 (*) 19 - 32 mEq/L   Glucose, Bld 127 (*) 70 - 99 mg/dL   BUN 55 (*) 6 - 23 mg/dL   Creatinine, Ser 2.44 (*) 0.50 - 1.10 mg/dL   Calcium 8.2 (*) 8.4 - 10.5 mg/dL   GFR calc non Af Amer 16 (*) >90 mL/min   GFR calc Af Amer 19 (*) >90 mL/min   Comment: (NOTE)     The eGFR has been calculated using the CKD EPI equation.     This calculation has not been validated in all clinical situations.     eGFR's persistently <90 mL/min signify possible Chronic Kidney     Disease.   Anion gap 16 (*) 5 - 15  PRO B NATRIURETIC PEPTIDE     Status: Abnormal   Collection Time    11/20/2013  1:19 PM      Result Value Ref Range   Pro B Natriuretic peptide (BNP) 2970.0 (*) 0 - 450 pg/mL  TROPONIN I     Status: None   Collection Time    11/07/2013  1:19 PM      Result Value Ref Range   Troponin I <0.30  <0.30 ng/mL   Comment:            Due to the release kinetics of cTnI,     a negative result within the first hours     of the onset of symptoms does not rule out     myocardial infarction with certainty.     If myocardial infarction is still suspected,     repeat the test at appropriate intervals.    Ct Head Wo Contrast  11/18/2013   CLINICAL DATA:  80 year old female with altered mental status. No known injury  EXAM: CT HEAD WITHOUT CONTRAST  TECHNIQUE: Contiguous axial images were obtained from the base of the skull through the vertex without intravenous contrast.  COMPARISON:  Brain MR 03/08/2013, prior head CT 03/04/2013  FINDINGS: Surgical changes of prior right parietal craniotomy. Otherwise, no acute bony abnormality of the calvarium. No aggressive calvarial lesion identified.  Frothy secretions of the sphenoid  sinus with minimal mucosal thickening of ethmoid air cells.  Unremarkable appearance of the bilateral orbits.  Trace fluid of the left mastoid air cells.  No acute intracranial hemorrhage. No midline shift. No mass effect. Senescent volume loss of the brain, compatible with findings on  prior MRI. Intracranial atherosclerotic changes. Ventricular system is unchanged in size and configuration from the prior MR and CT. Gray-white differentiation maintained in the supratentorial brain, with no evidence of acute ischemia.  There is 1 focus of hypodensity within the left brainstem at the level of the superior cerebellar peduncles (image 10 of series 3). This is in a region of streak artifact and motion, though was not present on comparison MR and CT.  IMPRESSION: There is a questionable new hypodense focus within the left side of the brainstem at the level of the superior cerebellar peduncle which is in a region of motion artifact and streak. If there is concern for acute abnormality, further evaluation with repeat MRI may be considered.  No evidence of acute intracranial hemorrhage, midline shift, or mass effect.  Senescent a brain volume loss.  Signed,  Dulcy Fanny. Earleen Newport, DO  [facility]  [facility]   Electronically Signed   By: Corrie Mckusick O.D.   On: 11/07/2013 15:25   Dg Chest Portable 1 View  11/12/2013   CLINICAL DATA:  Nonfunctioning right-sided PICC line; bilateral arm swelling ; on antibiotic therapy for chronic knee infection ; altered mental status  EXAM: PORTABLE CHEST - 1 VIEW  COMPARISON:  Portable chest x-ray of 07 November 2013  FINDINGS: The left PICC line tip lies in overlies the midportion of the SVC. The cardiopericardial silhouette is enlarged. The pulmonary vascularity is mildly engorged and indistinct. The interstitial markings of both lungs are increased. The hemidiaphragms are obscured consistent with pleural effusions. The bony structures are osteopenic.  IMPRESSION: Congestive heart failure with pulmonary interstitial edema and bilateral pleural effusions. Certainly pneumonia cannot be excluded at the lung bases.   Electronically Signed   By: David  Martinique   On: 11/18/2013 11:41    Review of Systems  Constitutional:        Bed ridden. Loss of 70 pounds over 10 months.  Respiratory:       Lung mass  Cardiovascular:       Hypertension, congestive heart disease, atrial fib, on anticoagulation  Genitourinary:       Poor kidney function, history of recurrent UTI  Musculoskeletal:       Knee wound with exposed part of total knee prosthesis present for a "little while".  Psychiatric/Behavioral:       Dementia   Blood pressure 126/85, pulse 95, temperature 97.8 F (36.6 C), temperature source Oral, resp. rate 15, height $RemoveBe'5\' 4"'[id number]$  (1.626 m), weight 49.2 kg (108 lb 7.5 oz), SpO2 93.00%. Physical Exam  Constitutional:  Thin, elderly white female, nonresponsive  Neck: Neck supple.  Cardiovascular:  Irregular rate  Respiratory: She has wheezes. She has rales.  GI: Soft.  Musculoskeletal:  Left knee with contracture of 105 to 110 degrees of fixed flexion.  Exposed part of lateral distal femoral condyle of a total knee prosthesis.  No redness.  No discharge, No odor.  Other extremities with fixed contractures.  Color OK.  Neurological:  Disoriented.  Obtunded.    Skin: Skin is warm and dry.  Psychiatric:  Unable to arouse.    Assessment/Plan: Exposed small part of a total knee on the left, the lateral anterior femoral component  with no signs of infection. Flexion contractures of multiple joints Congestive heart failure Atrial fib on anticoagulation Lung Mass Severe dementia and poor consciousness status Renal failure  No x-rays have been done of the left knee.  I will order.  She needs to have sterile dressing applied to the left knee and changed daily under sterile conditions.  She has PICC line and will need antibiotic coverage.  She is too ill to have any procedure done.  The wound may slowly heal on its own.  No much is exposed.  She is also on anticoagulation.  I will order dressings.  Need to observe.  General status is poor.  Kaylor Simenson 10/27/2013, 7:11 PM

## 2013-11-14 NOTE — ED Notes (Signed)
PICC assessed. Area surrounding PICC line heavily discolored with blistering noted, attempted to draw blood, minimal amount obtained. Attempted to flush PICC line with 10 cc normal saline, unable to flush.

## 2013-11-15 ENCOUNTER — Inpatient Hospital Stay (HOSPITAL_COMMUNITY): Payer: PRIVATE HEALTH INSURANCE

## 2013-11-15 DIAGNOSIS — D649 Anemia, unspecified: Secondary | ICD-10-CM

## 2013-11-15 DIAGNOSIS — I5032 Chronic diastolic (congestive) heart failure: Secondary | ICD-10-CM

## 2013-11-15 DIAGNOSIS — N179 Acute kidney failure, unspecified: Principal | ICD-10-CM

## 2013-11-15 DIAGNOSIS — E43 Unspecified severe protein-calorie malnutrition: Secondary | ICD-10-CM | POA: Insufficient documentation

## 2013-11-15 DIAGNOSIS — I4891 Unspecified atrial fibrillation: Secondary | ICD-10-CM

## 2013-11-15 LAB — PROTIME-INR
INR: 1.99 — AB (ref 0.00–1.49)
Prothrombin Time: 22.6 seconds — ABNORMAL HIGH (ref 11.6–15.2)

## 2013-11-15 LAB — FERRITIN: Ferritin: 611 ng/mL — ABNORMAL HIGH (ref 10–291)

## 2013-11-15 LAB — CBC
HEMATOCRIT: 23.3 % — AB (ref 36.0–46.0)
HEMOGLOBIN: 7.5 g/dL — AB (ref 12.0–15.0)
MCH: 28.7 pg (ref 26.0–34.0)
MCHC: 32.2 g/dL (ref 30.0–36.0)
MCV: 89.3 fL (ref 78.0–100.0)
Platelets: 461 10*3/uL — ABNORMAL HIGH (ref 150–400)
RBC: 2.61 MIL/uL — ABNORMAL LOW (ref 3.87–5.11)
RDW: 17.3 % — ABNORMAL HIGH (ref 11.5–15.5)
WBC: 9.2 10*3/uL (ref 4.0–10.5)

## 2013-11-15 LAB — CREATININE, URINE, RANDOM: CREATININE, URINE: 77.95 mg/dL

## 2013-11-15 LAB — COMPREHENSIVE METABOLIC PANEL
ALBUMIN: 1.7 g/dL — AB (ref 3.5–5.2)
ALK PHOS: 30 U/L — AB (ref 39–117)
ALT: 8 U/L (ref 0–35)
ANION GAP: 12 (ref 5–15)
AST: 10 U/L (ref 0–37)
BUN: 55 mg/dL — ABNORMAL HIGH (ref 6–23)
CHLORIDE: 110 meq/L (ref 96–112)
CO2: 19 mEq/L (ref 19–32)
Calcium: 7.7 mg/dL — ABNORMAL LOW (ref 8.4–10.5)
Creatinine, Ser: 2.41 mg/dL — ABNORMAL HIGH (ref 0.50–1.10)
GFR calc Af Amer: 19 mL/min — ABNORMAL LOW (ref >90)
GFR calc non Af Amer: 17 mL/min — ABNORMAL LOW (ref >90)
Glucose, Bld: 92 mg/dL (ref 70–99)
POTASSIUM: 4.1 meq/L (ref 3.7–5.3)
SODIUM: 141 meq/L (ref 137–147)
Total Bilirubin: 0.2 mg/dL — ABNORMAL LOW (ref 0.3–1.2)
Total Protein: 6.2 g/dL (ref 6.0–8.3)

## 2013-11-15 LAB — IRON AND TIBC
Iron: 66 ug/dL (ref 42–135)
SATURATION RATIOS: 53 % (ref 20–55)
TIBC: 125 ug/dL — ABNORMAL LOW (ref 250–470)
UIBC: 59 ug/dL — ABNORMAL LOW (ref 125–400)

## 2013-11-15 LAB — HEMOGLOBIN AND HEMATOCRIT, BLOOD
HCT: 23.7 % — ABNORMAL LOW (ref 36.0–46.0)
Hemoglobin: 7.6 g/dL — ABNORMAL LOW (ref 12.0–15.0)

## 2013-11-15 LAB — FOLATE: FOLATE: 6.8 ng/mL

## 2013-11-15 LAB — VITAMIN B12: Vitamin B-12: 385 pg/mL (ref 211–911)

## 2013-11-15 LAB — RETICULOCYTES
RBC.: 2.53 MIL/uL — ABNORMAL LOW (ref 3.87–5.11)
RETIC COUNT ABSOLUTE: 58.2 10*3/uL (ref 19.0–186.0)
Retic Ct Pct: 2.3 % (ref 0.4–3.1)

## 2013-11-15 LAB — MRSA PCR SCREENING: MRSA by PCR: NEGATIVE

## 2013-11-15 LAB — SODIUM, URINE, RANDOM: Sodium, Ur: <20 mEq/L

## 2013-11-15 MED ORDER — HYDRALAZINE HCL 20 MG/ML IJ SOLN: 10.0000 mg | Freq: Four times a day (QID) | INTRAMUSCULAR | Status: DC | PRN

## 2013-11-15 MED ORDER — ENOXAPARIN SODIUM 30 MG/0.3ML ~~LOC~~ SOLN: 30.0000 mg | SUBCUTANEOUS | Status: DC

## 2013-11-15 MED ORDER — LORAZEPAM 2 MG/ML IJ SOLN
0.5000 mg | Freq: Four times a day (QID) | INTRAMUSCULAR | Status: DC | PRN
  Administered 2013-11-15 – 2013-11-16 (×3): 0.5 mg via INTRAVENOUS
  Filled 2013-11-15 (×3): qty 1

## 2013-11-15 NOTE — Progress Notes (Signed)
INITIAL NUTRITION ASSESSMENT  DOCUMENTATION CODES Per approved criteria  -Severe malnutrition in the context of chronic illness   Pt meets criteria for severe MALNUTRITION in the context of chronic illness as evidenced by moderate muscle (temporal, clavicle) wasting and fat mass depletion (arms, orbital) regions.  INTERVENTION: Add Magic cup TID with meals, each supplement provides 290 kcal and 9 grams of protein   Recommend obtain re-weight   NUTRITION DIAGNOSIS: Inadequate oral intake related to acute encephalopathy, acute renal falure as evidenced by weight loss, moderate muscle wasting to multiple regions and fat mass loss, dehydrated on admission  Goal: Meet nutrition needs as able due to end-stage dementia  Monitor:  Care decisions. Pt remains full code.   Reason for Assessment: Malnutrition screen score = 5    80 y.o. female  ASSESSMENT: Pt is  from [place].  Altered mental status on admission. Hx of end-stage dementia, cancer.  Increased lethargy and change from baseline noted 3 days prior to admission. She is bedbound. Daughter reports severe weight loss past 10 months. Pt has muscle wasting and fat mass depletion in multiple regions. ST has completed bedside swallow study. Staff to assist with feeding.   Nutrition Focused Physical Exam:  Subcutaneous Fat:  Orbital Region: moderate wasting Upper Arm Region: moderate wasting Thoracic and Lumbar Region: moderate wasting  Muscle:  Temple Region: mild-moderate wasting Clavicle Bone Region: moderate wasting Clavicle and Acromion Bone Region: severe wasting Scapular Bone Region: moderate wasting Dorsal Hand: bruised, unable to assess Patellar Region: WDL Anterior Thigh Region: WDL Posterior Calf Region: not assessed  Edema: mild     Height: Ht Readings from Last 1 Encounters:  11/12/2013 5\' 4"  (1.626 m)    Weight: Wt Readings from Last 1 Encounters:  10/29/2013 108 lb 7.5 oz (49.2 kg)  Re-weight obtained on  11/15/13- 117.3# (53.3 kg)  Ideal Body Weight: 120#   Wt Readings from Last 10 Encounters:  11/17/2013 108 lb 7.5 oz (49.2 kg)  03/09/13 159 lb 9.8 oz (72.4 kg)  02/13/13 160 lb 11.5 oz (72.9 kg)  06/21/12 172 lb (78.019 kg)    Usual Body Weight: 160#  BMI:  Body mass index is 18.61 kg/(m^2). normal range Estimated Nutritional Needs: Kcal: 1600-1800  Protein: 80 gr Fluid: 1.6-1.8 liters daily  Skin: abrasion to left knee, open area to sacrum per nursing  Diet Order: Dysphagia 2 with thin liquids  EDUCATION NEEDS: -Education not appropriate at this time   Intake/Output Summary (Last 24 hours) at 11/15/13 1428 Last data filed at 11/15/13 0634  Gross per 24 hour  Intake      0 ml  Output    750 ml  Net   -750 ml    Last BM: 9/23  Labs:   Recent Labs Lab 11/01/2013 1319 11/15/13 0555  NA 140 141  K 4.3 4.1  CL 106 110  CO2 18* 19  BUN 55* 55*  CREATININE 2.44* 2.41*  CALCIUM 8.2* 7.7*  GLUCOSE 127* 92    CBG (last 3)  No results found for this basename: GLUCAP,  in the last 72 hours  Scheduled Meds: . acidophilus  1 capsule Oral Daily  . amLODipine  10 mg Oral Daily  . cholecalciferol  1,000 Units Oral BID  . dronabinol  5 mg Oral BID AC  . famotidine  20 mg Oral Daily  . feeding supplement (PRO-STAT SUGAR FREE 64)  30 mL Oral TID WC  . iron polysaccharides  150 mg Oral Daily  . sodium  bicarbonate  1,950 mg Oral TID    Continuous Infusions: . sodium chloride      Past Medical History  Diagnosis Date  . Breast cancer   . Lung mass   . Venous insufficiency   . Senile macular degeneration   . Subdural hemorrhage following injury, without mention of open intracranial wound, unspecified state of consciousness   . Unspecified kidney injury without mention of open wound into cavity   . Anxiety state, unspecified   . Hyposmolality and/or hyponatremia   . Esophageal reflux   . Hypertension   . Acute renal failure syndrome   . Sepsis   . Dementia    . Mass of lung 06/20/2012  . Compression fracture of lumbar vertebra     L2, L4   . DDD (degenerative disc disease), lumbar     History reviewed. No pertinent past surgical history.  Colman Cater MS,RD,CSG,LDN Office: [phone] Pager: [phone]

## 2013-11-15 NOTE — Progress Notes (Signed)
[facility]                                                                              Patient Demographics  [name], is a 80 y.o. female, DOB - 01-01-1923, ZOX:[id number]  Admit date - 10/27/2013   Admitting Physician Doree Albee, MD  Outpatient Primary MD for the patient is Jani Gravel, MD  LOS - 1   Chief Complaint  Patient presents with  . Altered Mental Status      HPI on 11/21/2013 [name] is a 80 y.o. female with a history end-stage dementia and is bedbound presented with altered mental status that began approximately 2 hours prior to admission. Patient is a resident of a skilled nursing facility. The patient has been receiving intravenous Zyvox through a PICC line for pneumonia and has been increasingly lethargic in the last few days. The patient has lost approximately 70 pounds over the last 10 months.  She was admitted for further management as she was found to be clinically and biochemically dehydrated.   Assessment & Plan   Acute encephalopathy -Patient has a history of end-stage dementia however has been increasingly lethargic over the past few days -Currently patient is afebrile no leukocytosis -Was on Zyvox at the nursing home for suspected pneumonia -Patient also has an abrasion on her left knee, her knee replacement is currently showing however does not appear to be infected -Will obtain blood cultures -UA showed 3-6 WBC, few bacteria, trace leukocytes -Chest x-ray: Congestive heart failure with pulmonary interstitial edema and bilateral pleural effusions, pneumonia cannot be excluded at the lung bases  Acute renal failure possibly secondary dehydration -Currently creatinine is 2.4 -Will continue to give patient IV fluids -Will obtain urine electrolytes as well as renal ultrasound -Will continue to monitor her urine output and BMP  Normocytic anemia -Will obtain fecal occult blood test as well as anemia panel -Hemoglobin  currently 7.5, may be dilutional component -Baseline hemoglobin approximately 9-10 -Will transfuse if patient's hemoglobin falls below 7  Chronic Diastolic heart failure -Patient currently has an elevated BNP 2970 -Last echocardiogram in January 2015: EF of 81-19%, grade 1 diastolic dysfunction -Will continue to monitor intake and output -At this time patient does not appear to be volume overloaded   Atrial fibrillation on anticoagulation -Will hold Xarelto due to her anemia -Currently rate controlled  Hypertension -Will continue her amlodipine once able to swallow -Patient placed on hydralazine as needed  Malnutrition -Continue Marinol  Exposed hardware of left knee -Patient status post knee replacement years ago -Appears to have injured herself at the nursing home at which point her hardware has not been exposed -Orthopedics consulted appreciated, patient is not a surgical candidate at this time, recommended conservative management -Does not appear to be infected  Code Status: Full  Family Communication: Daughter via phone, granddaughter at bedside  Disposition Plan: Admitted  Time Spent in minutes   45 minutes  Procedures  None  Consults   Orthopedics  DVT Prophylaxis  SCDs  Lab Results  Component Value Date   PLT 461* 11/15/2013    Medications  Scheduled Meds: . acidophilus  1 capsule Oral Daily  . amLODipine  10 mg Oral Daily  . cholecalciferol  1,000 Units Oral BID  . dronabinol  5 mg Oral BID AC  . [START ON 11/16/2013] enoxaparin (LOVENOX) injection  30 mg Subcutaneous Q24H  . famotidine  20 mg Oral Daily  . feeding supplement (PRO-STAT SUGAR FREE 64)  30 mL Oral TID WC  . iron polysaccharides  150 mg Oral Daily  . linezolid  600 mg Intravenous Q12H  . sodium bicarbonate  1,950 mg Oral TID   Continuous Infusions: . sodium chloride     PRN Meds:.acetaminophen, hydrALAZINE, ipratropium-albuterol, morphine injection, ondansetron (ZOFRAN) IV,  ondansetron  Antibiotics    Anti-infectives   Start     Dose/Rate Route Frequency Ordered Stop   11/11/2013 2200  linezolid (ZYVOX) IVPB 600 mg     600 mg 300 mL/hr over 60 Minutes Intravenous Every 12 hours 11/06/2013 1618          Subjective:   [name] seen and examined today. Patient currently nonverbal, morning.  Objective:   Filed Vitals:   11/18/2013 1600 11/07/2013 1818 11/20/2013 2240 11/15/13 0632  BP: 114/56 126/85 117/73 144/106  Pulse:  95 60 104  Temp:  97.8 F (36.6 C) 97.8 F (36.6 C) 98 F (36.7 C)  TempSrc:  Oral Axillary Axillary  Resp: 15  15 20   Height:  5\' 4"  (1.626 m)    Weight:  49.2 kg (108 lb 7.5 oz)    SpO2:  93% 90% 93%    Wt Readings from Last 3 Encounters:  11/18/2013 49.2 kg (108 lb 7.5 oz)  03/09/13 72.4 kg (159 lb 9.8 oz)  02/13/13 72.9 kg (160 lb 11.5 oz)     Intake/Output Summary (Last 24 hours) at 11/15/13 1258 Last data filed at 11/15/13 1610  Gross per 24 hour  Intake      0 ml  Output    750 ml  Net   -750 ml    Exam  General: Well developed, malnourished  HEENT: NCAT, PERRLA, EOMI, Anicteic Sclera, mucous membranes dry   Cardiovascular: S1 S2 auscultated, irregular  Respiratory: Diminished breath sounds  Abdomen: Soft, nontender, nondistended, + bowel sounds  Extremities: warm dry without cyanosis clubbing or edema, contracted lower extremities, left knee bandage  Data Review   Micro Results No results found for this or any previous visit (from the past 240 hour(s)).  Radiology Reports Dg Knee 1-2 Views Left  11/08/2013   CLINICAL DATA:  Metal exposure.  EXAM: LEFT KNEE - 1-2 VIEW  COMPARISON:  None.  FINDINGS: There is a total knee arthroplasty with intact hardware. There is also an Enders rod in the tibia. I do not see any obvious soft tissue covering the femoral prosthesis. The knee is and hyperflexion.  IMPRESSION: Hyperflexion deformity of the knee.  No obvious soft tissues covering the knee prosthesis. It  could be exposed.   Electronically Signed   By: Kalman Jewels M.D.   On: 11/21/2013 20:02   Ct Head Wo Contrast  11/02/2013   CLINICAL DATA:  80 year old female with altered mental status. No known injury  EXAM: CT HEAD WITHOUT CONTRAST  TECHNIQUE: Contiguous axial images were obtained from the base of the skull through the vertex without intravenous contrast.  COMPARISON:  Brain MR 03/08/2013, prior head CT 03/04/2013  FINDINGS: Surgical changes of prior right parietal craniotomy. Otherwise, no acute bony abnormality of the calvarium. No aggressive calvarial lesion identified.  Frothy secretions of the sphenoid sinus with minimal mucosal thickening of ethmoid air  cells.  Unremarkable appearance of the bilateral orbits.  Trace fluid of the left mastoid air cells.  No acute intracranial hemorrhage. No midline shift. No mass effect. Senescent volume loss of the brain, compatible with findings on prior MRI. Intracranial atherosclerotic changes. Ventricular system is unchanged in size and configuration from the prior MR and CT. Gray-white differentiation maintained in the supratentorial brain, with no evidence of acute ischemia.  There is 1 focus of hypodensity within the left brainstem at the level of the superior cerebellar peduncles (image 10 of series 3). This is in a region of streak artifact and motion, though was not present on comparison MR and CT.  IMPRESSION: There is a questionable new hypodense focus within the left side of the brainstem at the level of the superior cerebellar peduncle which is in a region of motion artifact and streak. If there is concern for acute abnormality, further evaluation with repeat MRI may be considered.  No evidence of acute intracranial hemorrhage, midline shift, or mass effect.  Senescent a brain volume loss.  Signed,  Dulcy Fanny. Earleen Newport, DO  [facility]  [facility]   Electronically Signed   By: Corrie Mckusick O.D.   On:  10/27/2013 15:25   Dg Chest Portable 1 View  10/29/2013   CLINICAL DATA:  Nonfunctioning right-sided PICC line; bilateral arm swelling ; on antibiotic therapy for chronic knee infection ; altered mental status  EXAM: PORTABLE CHEST - 1 VIEW  COMPARISON:  Portable chest x-ray of 07 November 2013  FINDINGS: The left PICC line tip lies in overlies the midportion of the SVC. The cardiopericardial silhouette is enlarged. The pulmonary vascularity is mildly engorged and indistinct. The interstitial markings of both lungs are increased. The hemidiaphragms are obscured consistent with pleural effusions. The bony structures are osteopenic.  IMPRESSION: Congestive heart failure with pulmonary interstitial edema and bilateral pleural effusions. Certainly pneumonia cannot be excluded at the lung bases.   Electronically Signed   By: David  Martinique   On: 10/27/2013 11:41   Dg Chest Port 1 View  11/07/2013   CLINICAL DATA:  Left-sided PICC line placement  EXAM: PORTABLE CHEST - 1 VIEW  COMPARISON:  Portable chest x-ray of 03/12/2013  FINDINGS: The left PICC line coils in the innominate vein with the tip extending cephalad toward the lower left neck probably in the left internal jugular vein. No pneumothorax is seen. There is evidence of pulmonary vascular congestion with basilar atelectasis and probable small effusions as well. Cardiomegaly is stable.  IMPRESSION: The left PICC line coils in the innominate vein, with the tip extending cephalad probably within the left internal jugular vein.   Electronically Signed   By: Ivar Drape M.D.   On: 11/07/2013 12:29   Dg Chest Port 1v Same Day  11/07/2013   CLINICAL DATA:  Recent PICC line placement  EXAM: PORTABLE CHEST - 1 VIEW SAME DAY  COMPARISON:  Film from earlier in the same day  FINDINGS: The tip of the PICC line from the left side remains somewhat looped in the superior vena cava proximally. The cardiac shadow and left-sided pleural effusion are stable. No acute  abnormality is noted.  IMPRESSION: PICC line remains looped in the superior vena cava. There is a plan for repositioning and reimaging prior to discharge   Electronically Signed   By: Inez Catalina M.D.   On: 11/07/2013 15:09   Dg Chest Port 1v Same Day  11/07/2013   CLINICAL DATA:  PICC  line adjustment  EXAM: PORTABLE CHEST - 1 VIEW SAME DAY  COMPARISON:  11/07/2013 at 1214 hr  FINDINGS: The PICC line has been adjusted such that the catheter extends down the superior vena cava with the tip deflecting into the azygos vein over several mm.  Left pleural effusion and cardiomegaly again demonstrated. No pneumothorax  IMPRESSION: PICC line with tip extending down the superior vena cava with tip deflecting into the azygos vein. This may be repositioned itself into the SVC with use.   Electronically Signed   By: Suzy Bouchard M.D.   On: 11/07/2013 13:52    CBC  Recent Labs Lab 11/02/2013 1110 11/15/13 0555  WBC 10.3 9.2  HGB 8.4* 7.5*  HCT 25.9* 23.3*  PLT 557* 461*  MCV 88.4 89.3  MCH 28.7 28.7  MCHC 32.4 32.2  RDW 16.8* 17.3*  LYMPHSABS 0.6*  --   MONOABS 0.4  --   EOSABS 0.0  --   BASOSABS 0.0  --     Chemistries   Recent Labs Lab 11/11/2013 1319 11/15/13 0555  NA 140 141  K 4.3 4.1  CL 106 110  CO2 18* 19  GLUCOSE 127* 92  BUN 55* 55*  CREATININE 2.44* 2.41*  CALCIUM 8.2* 7.7*  AST  --  10  ALT  --  8  ALKPHOS  --  30*  BILITOT  --  0.2*   ------------------------------------------------------------------------------------------------------------------ estimated creatinine clearance is 12.3 ml/min (by C-G formula based on Cr of 2.41). ------------------------------------------------------------------------------------------------------------------ No results found for this basename: HGBA1C,  in the last 72 hours ------------------------------------------------------------------------------------------------------------------ No results found for this basename: CHOL, HDL,  LDLCALC, TRIG, CHOLHDL, LDLDIRECT,  in the last 72 hours ------------------------------------------------------------------------------------------------------------------ No results found for this basename: TSH, T4TOTAL, FREET3, T3FREE, THYROIDAB,  in the last 72 hours ------------------------------------------------------------------------------------------------------------------  Recent Labs  11/15/13 0948  RETICCTPCT 2.3    Coagulation profile  Recent Labs Lab 11/15/13 0555  INR 1.99*    No results found for this basename: DDIMER,  in the last 72 hours  Cardiac Enzymes  Recent Labs Lab 11/06/2013 1319  TROPONINI <0.30   ------------------------------------------------------------------------------------------------------------------ No components found with this basename: POCBNP,     Mikell Camp D.O. on 11/15/2013 at 12:58 PM  Between 7am to 7pm - Pager - [phone]  After 7pm go to [email or web address] - password [email or web address]  And look for the night coverage person covering for me after hours  [facility] Office  [phone]

## 2013-11-15 NOTE — Care Management Note (Addendum)
    Page 1 of 1   11/16/2013     11:50:59 AM CARE MANAGEMENT NOTE 11/16/2013  Patient:  [name], [name]   Account Number:  [id number]  Date Initiated:  11/15/2013  Documentation initiated by:  Theophilus Kinds  Subjective/Objective Assessment:   Pt admitted from [facility] with dehydration and altered mental status. Pt will return to facility when medically stable.     Action/Plan:   CSW is aware and will arrange discharge to facility when medically stable.   Anticipated DC Date:  December 07, 2013   Anticipated DC Plan:  SKILLED NURSING FACILITY  In-house referral  Clinical Social Worker      DC Planning Services  CM consult      Choice offered to / List presented to:             Status of service:  Completed, signed off Medicare Important Message given?  YES (If response is "NO", the following Medicare IM given date fields will be blank) Date Medicare IM given:  11/16/2013 Medicare IM given by:  Theophilus Kinds Date Additional Medicare IM given:   Additional Medicare IM given by:    Discharge Disposition:  [facility]  Per UR Regulation:    If discussed at Long Length of Stay Meetings, dates discussed:    Comments:  11/16/13 [place], RN BSN CM Pt receiving blood today. Pt may discharge over the weekend as comfort care. Pt can return to facility, however, if family wants to take pt home at discharge, weekend staff will need to arrange any home health or DME services.  11/15/13 [place], RN BSN CM

## 2013-11-15 NOTE — Progress Notes (Signed)
UR chart review completed.  

## 2013-11-15 NOTE — Evaluation (Addendum)
Clinical/Bedside Swallow Evaluation Patient Details  Name: [name] MRN: [id number] Date of Birth: 1922/09/21  Today's Date: 11/15/2013 Time: 1200-1240 SLP Time Calculation (min): 40 min  Past Medical History:  Past Medical History  Diagnosis Date  . Breast cancer   . Lung mass   . Venous insufficiency   . Senile macular degeneration   . Subdural hemorrhage following injury, without mention of open intracranial wound, unspecified state of consciousness   . Unspecified kidney injury without mention of open wound into cavity   . Anxiety state, unspecified   . Hyposmolality and/or hyponatremia   . Esophageal reflux   . Hypertension   . Acute renal failure syndrome   . Sepsis   . Dementia   . Mass of lung 06/20/2012  . Compression fracture of lumbar vertebra     L2, L4   . DDD (degenerative disc disease), lumbar    Past Surgical History: History reviewed. No pertinent past surgical history.  HPI:  This is an 80 year old lady who has end-stage dementia and is bedbound presents with altered mental status that began approximately 2 hours prior to admission. Patient is a resident of a skilled nursing facility. The patient has been receiving intravenous Zyvox through a PICC line for pneumonia and has been increasingly lethargic in the last few days. The patient is bedbound at her baseline. The patient has lost approximately 70 pounds over the last 10 months. She is now being admitted for further management as she was found to be clinically and biochemically dehydrated.   Chest X-ray: Congestive heart failure with pulmonary interstitial edema and  bilateral pleural effusions. Certainly pneumonia cannot be excluded  at the lung bases.  Assessment / Plan / Recommendation Clinical Impression  [name] was seen for bedside swallow evaluation with family present in room. Family reported that pt is bedbound and consumes all meals in reclined position. SLP elevated HOB slowly;  family frequently requested that SLP stop so pt ultimately elevated to ~40 degrees. Pt's legs are contracted and unable to be repositioned due to back injury less than a year ago per family. Pt moaning "Help me" throughout visit (in reclined and inclined position), but denies pain. SLP uncomfortable with feeding pt in partially reclined position, but family is adamant about bed positioning because they "don't want to hurt her". SLP explained reasons for raising HOB and folding pillow to tip head forward for po trials.   Oral cavity was very dry and she was given ice chips to moisten in preparation for food/drink. Swallow initiation is slightly delayed but with seemingly adequate hyolaryngeal excursion. Pt unable to follow directions to take small, controlled straw sips water (cup sips were judged to be riskier due to head positioning), so she gulped once she grasped the suck and swallow concept. SLP pinched straw to help control flow. Immediate coughing elicited after several sequential swallows and daughter immediately verbalized, "Oh she always gets strangled on the first sip". Pt tolerated puree without incident; mild lingual residue when presented with mech soft textures. Pt has a strong cough and vocal quality remained clear. SLP spoke with daughter about risks for aspiration and advised to feed pt with [facility] raised as high as tolerated. Daughter smiled and nodded, but did not ask further questions.  Recommend D2 and thin liquids presented via feeder controlled straw sips when pt is sitting as upright as possible with pillow rolled behind head. Consider crushing pills in puree, although daughter reports that her mom takes them whole in  puree. Above to RN. SLP will follow for diet tolerance and ongoing family education x1.    Aspiration Risk  Moderate    Diet Recommendation Dysphagia 2 (Fine chop);Thin liquid   Liquid Administration via: Straw Medication Administration: Whole meds with  puree Supervision: Staff to assist with self feeding;Full supervision/cueing for compensatory strategies Compensations: Slow rate;Small sips/bites;Check for pocketing Postural Changes and/or Swallow Maneuvers: Upright 30-60 min after meal (Raise HOB to as high as tolerated)    Other  Recommendations Oral Care Recommendations: Oral care BID;Staff/trained caregiver to provide oral care Other Recommendations: Clarify dietary restrictions   Follow Up Recommendations  24 hour supervision/assistance    Frequency and Duration min 2x/week  1 week   Pertinent Vitals/Pain vss    SLP Swallow Goals   Pt will demonstrate safe and efficient consumption of least restrictive diet with use of strategies as needed.   Swallow Study Prior Functional Status   Bed bound resident at [facility]; family reports mech soft diet with thin liquids PTA.    General Date of Onset: 11/01/2013 HPI: This is an 80 year old lady who has end-stage dementia and is bedbound presents with altered mental status that began approximately 2 hours prior to admission. Patient is a resident of a skilled nursing facility. The patient has been receiving intravenous Zyvox through a PICC line for pneumonia and has been increasingly lethargic in the last few days. The patient is bedbound at her baseline. The patient has lost approximately 70 pounds over the last 10 months. She is now being admitted for further management as she was found to be clinically and biochemically dehydrated. Type of Study: Bedside swallow evaluation Diet Prior to this Study: NPO Temperature Spikes Noted: No Respiratory Status: Nasal cannula History of Recent Intubation: No Behavior/Cognition: Alert;Confused Oral Cavity - Dentition:  (has lower teeth but not upper) Self-Feeding Abilities: Total assist Patient Positioning: Partially reclined Baseline Vocal Quality: Clear Volitional Cough: Weak Volitional Swallow: Unable to elicit    Oral/Motor/Sensory Function  Overall Oral Motor/Sensory Function: Impaired (unable to formally assess due to decreased mentation) Labial ROM: Within Functional Limits Labial Symmetry: Within Functional Limits Lingual ROM: Reduced right;Reduced left Lingual Strength: Reduced   Ice Chips Ice chips: Impaired Presentation: Spoon Pharyngeal Phase Impairments: Suspected delayed Swallow   Thin Liquid Thin Liquid: Impaired Presentation: Straw Oral Phase Impairments: Reduced labial seal Pharyngeal  Phase Impairments: Cough - Immediate (likely due to pt positioning)    Nectar Thick Nectar Thick Liquid: Not tested   Honey Thick Honey Thick Liquid: Not tested   Puree Puree: Within functional limits Presentation: Spoon   Solid       Solid: Impaired Presentation: Spoon Oral Phase Impairments: Reduced lingual movement/coordination Oral Phase Functional Implications: Oral residue      Thank you,  Genene Churn, [place]  Layken Beg 11/15/2013,1:37 PM

## 2013-11-15 NOTE — Clinical Social Work Psychosocial (Signed)
Clinical Social Work Department BRIEF PSYCHOSOCIAL ASSESSMENT 11/15/2013  Patient:  [name], [name]     Account Number:  [id number]     [place] date:  10/30/2013  Clinical Social Worker:  Wyatt Haste  Date/Time:  11/15/2013 03:06 PM  Referred by:  Physician  Date Referred:  11/15/2013 Referred for  SNF Placement   Other Referral:   Interview type:  Family Other interview type:   Abigail Butts- daughter    PSYCHOSOCIAL DATA Living Status:  FACILITY Admitted from facility:  [facility] Level of care:  [place] Primary support name:  Abigail Butts Primary support relationship to patient:  CHILD, ADULT Degree of support available:   supportive    CURRENT CONCERNS Current Concerns  Post-Acute Placement   Other Concerns:    SOCIAL WORK ASSESSMENT / PLAN CSW met with pt's daughter, Abigail Butts and pt's granddaughter at bedside. Pt known to CSW from previous admissions. She has been a long term resident at [facility]. Family report pt has been declining for months now. She has also had significant weight loss. Abigail Butts and pt's granddaughter both became emotional as they discussed conversations they have had with MD. Pt's brother is also in hospital right now and having end of life discussions. CSW provided support. CSW addressed conversation of code status, following up after MD this morning. After extensive discussion, Abigail Butts feels DNR is appropriate. She is not ready for comfort care, but agrees that she would not want pt to be coded. MD notified. CSW did not address d/c planning as Abigail Butts was ready to talk to MD again, but CSW will follow up tomorrow.   Assessment/plan status:  Psychosocial Support/Ongoing Assessment of Needs Other assessment/ plan:   Information/referral to community resources:   to be determined    PATIENT'S/FAMILY'S RESPONSE TO PLAN OF CARE: Family appear to be feeling overwhelmed with information provided regarding pt's prognosis. Support provided.        Benay Pike, [place]

## 2013-11-16 DIAGNOSIS — I319 Disease of pericardium, unspecified: Secondary | ICD-10-CM

## 2013-11-16 DIAGNOSIS — R6889 Other general symptoms and signs: Secondary | ICD-10-CM

## 2013-11-16 DIAGNOSIS — E43 Unspecified severe protein-calorie malnutrition: Secondary | ICD-10-CM

## 2013-11-16 LAB — BASIC METABOLIC PANEL
Anion gap: 12 (ref 5–15)
BUN: 53 mg/dL — ABNORMAL HIGH (ref 6–23)
CO2: 18 meq/L — AB (ref 19–32)
Calcium: 7.6 mg/dL — ABNORMAL LOW (ref 8.4–10.5)
Chloride: 111 mEq/L (ref 96–112)
Creatinine, Ser: 2.41 mg/dL — ABNORMAL HIGH (ref 0.50–1.10)
GFR calc Af Amer: 19 mL/min — ABNORMAL LOW (ref >90)
GFR, EST NON AFRICAN AMERICAN: 17 mL/min — AB (ref >90)
GLUCOSE: 100 mg/dL — AB (ref 70–99)
POTASSIUM: 4.1 meq/L (ref 3.7–5.3)
Sodium: 141 mEq/L (ref 137–147)

## 2013-11-16 LAB — CBC
HCT: 22.2 % — ABNORMAL LOW (ref 36.0–46.0)
Hemoglobin: 7 g/dL — ABNORMAL LOW (ref 12.0–15.0)
MCH: 28.6 pg (ref 26.0–34.0)
MCHC: 31.5 g/dL (ref 30.0–36.0)
MCV: 90.6 fL (ref 78.0–100.0)
PLATELETS: 390 10*3/uL (ref 150–400)
RBC: 2.45 MIL/uL — AB (ref 3.87–5.11)
RDW: 17.4 % — ABNORMAL HIGH (ref 11.5–15.5)
WBC: 9.8 10*3/uL (ref 4.0–10.5)

## 2013-11-16 LAB — PREPARE RBC (CROSSMATCH)

## 2013-11-16 LAB — OSMOLALITY: Osmolality: 300 mOsm/kg (ref 275–300)

## 2013-11-16 LAB — OSMOLALITY, URINE: Osmolality, Ur: 344 mOsm/kg — ABNORMAL LOW (ref 390–1090)

## 2013-11-16 LAB — ABO/RH: ABO/RH(D): A POS

## 2013-11-16 MED ORDER — MORPHINE SULFATE 10 MG/ML IJ SOLN
INTRAMUSCULAR | Status: AC
  Filled 2013-11-16: qty 10

## 2013-11-16 MED ORDER — FUROSEMIDE 10 MG/ML IJ SOLN
20.0000 mg | Freq: Once | INTRAMUSCULAR | Status: AC
  Administered 2013-11-16: 20 mg via INTRAVENOUS
  Filled 2013-11-16: qty 2

## 2013-11-16 MED ORDER — SCOPOLAMINE 1 MG/3DAYS TD PT72
1.0000 | MEDICATED_PATCH | TRANSDERMAL | Status: DC
  Filled 2013-11-16: qty 1

## 2013-11-16 MED ORDER — SODIUM CHLORIDE 0.9 % IV SOLN
1.0000 mg/h | INTRAVENOUS | Status: DC
  Administered 2013-11-16: 1 mg/h via INTRAVENOUS
  Administered 2013-11-18: 3 mg/h via INTRAVENOUS
  Filled 2013-11-16 (×2): qty 10

## 2013-11-16 MED ORDER — LORAZEPAM 2 MG/ML IJ SOLN
1.0000 mg | INTRAMUSCULAR | Status: DC | PRN
  Administered 2013-11-16 – 2013-11-18 (×2): 1 mg via INTRAVENOUS
  Filled 2013-11-16 (×2): qty 1

## 2013-11-16 NOTE — Plan of Care (Signed)
Problem: Phase I Progression Outcomes Goal: OOB as tolerated unless otherwise ordered Outcome: Not Applicable Date Met:  83/47/58 Pt is bedridden/contracted Goal: Voiding-avoid urinary catheter unless indicated Outcome: Not Progressing Pt has foley in place, receiving IV diuretics with blood transfusion and possibility of transition to comfort care. Goal: Hemodynamically stable Outcome: Progressing Pt receiving 2 units of blood for hgb of 7.0.  BP and pulse stable at this time.

## 2013-11-16 NOTE — Progress Notes (Signed)
[facility]                                                                              Patient Demographics  [name], is a 80 y.o. female, DOB - Oct 14, 1922, PIR:[id number]  Admit date - 11/01/2013   Admitting Physician Doree Albee, MD  Outpatient Primary MD for the patient is Jani Gravel, MD  LOS - 2   Chief Complaint  Patient presents with  . Altered Mental Status      HPI on 11/08/2013 [name] is a 80 y.o. female with a history end-stage dementia and is bedbound presented with altered mental status that began approximately 2 hours prior to admission. Patient is a resident of a skilled nursing facility. The patient has been receiving intravenous Zyvox through a PICC line for pneumonia and has been increasingly lethargic in the last few days. The patient has lost approximately 70 pounds over the last 10 months.  She was admitted for further management as she was found to be clinically and biochemically dehydrated.   Assessment & Plan   Acute encephalopathy -Patient has a history of end-stage dementia however has been increasingly lethargic over the past few days -Currently patient is afebrile no leukocytosis -Was on Zyvox at the nursing home for suspected pneumonia -Patient also has an abrasion on her left knee, her knee replacement is currently showing however does not appear to be infected -blood cultures pending -UA showed 3-6 WBC, few bacteria, trace leukocytes -Chest x-ray: Congestive heart failure with pulmonary interstitial edema and bilateral pleural effusions, pneumonia cannot be excluded at the lung bases  Acute renal failure possibly secondary dehydration -Currently creatinine is 2.4 (unchanged) -Will continue to give patient IV fluids -Renal US: No evidence of renal mass or hydronephrosis, medical renal disease -FeNa 0.4%, prerenal -Will continue to monitor her urine output and BMP  Normocytic anemia -Occult testing pending -Anemia  panel showed adequate iron as well as iron stores. -Hemoglobin 7.0, 2 units of packed red blood cells have been ordered for transfusion, with 20 of Lasix IV to be given between units. -Baseline hemoglobin approximately 6-30  Chronic Diastolic heart failure -Patient currently has an elevated BNP 2970 -Last echocardiogram in January 2015: EF of 16-01%, grade 1 diastolic dysfunction -Will continue to monitor intake and output -Pending echocardiogram -At this time patient does not appear to be volume overloaded  -Contact the patient's cardiologist, Dr. Rosalita Chessman, in [place], [place].  no recent echo has been conducted.  Atrial fibrillation on anticoagulation -Will hold Xarelto due to her anemia -Currently rate controlled -Spoke with patient's cardiologist who recommended discontinuing Zoloft though and using aspirin.  Hypertension -Will continue her amlodipine once able to swallow -Patient placed on hydralazine as needed  Malnutrition -Continue Marinol  Exposed hardware of left knee -Patient status post knee replacement years ago -Appears to have injured herself at the nursing home at which point her hardware has not been exposed -Orthopedics consulted appreciated, patient is not a surgical candidate at this time, recommended conservative management -Does not appear to be infected  Nonspecific pain -Patient continues to moan and state she is in pain however very nonspecific. Will continue pain control as needed.  Code Status: DNR  Family Communication: Family at bedside.  Disposition Plan: Admitted.  Had a long conversation with daughter Claiborne Billings, agreed to DO NOT RESUSCITATE CODE STATUS. Agreed to give patient treatment and monitor her for a few days. If no improvement may consider comfort measures.  Time Spent in minutes   45 minutes  Procedures  None  Consults   Orthopedics  DVT Prophylaxis  SCDs  Lab Results  Component Value Date   PLT 390 11/16/2013     Medications  Scheduled Meds: . acidophilus  1 capsule Oral Daily  . amLODipine  10 mg Oral Daily  . cholecalciferol  1,000 Units Oral BID  . dronabinol  5 mg Oral BID AC  . famotidine  20 mg Oral Daily  . feeding supplement (PRO-STAT SUGAR FREE 64)  30 mL Oral TID WC  . furosemide  20 mg Intravenous Once  . iron polysaccharides  150 mg Oral Daily  . sodium bicarbonate  1,950 mg Oral TID   Continuous Infusions: . sodium chloride     PRN Meds:.acetaminophen, hydrALAZINE, ipratropium-albuterol, LORazepam, morphine injection, ondansetron (ZOFRAN) IV, ondansetron  Antibiotics    Anti-infectives   Start     Dose/Rate Route Frequency Ordered Stop   11/16/2013 2200  linezolid (ZYVOX) IVPB 600 mg  Status:  Discontinued     600 mg 300 mL/hr over 60 Minutes Intravenous Every 12 hours 11/07/2013 1618 11/15/13 1318        Subjective:   [name] seen and examined today. Patient currently moaning in pain.  She cannot explain what type of pain she is feeling or show me the location.  She states she is ready to go home.   Objective:   Filed Vitals:   11/16/13 0453 11/16/13 1050 11/16/13 1117 11/16/13 1217  BP: 118/64 115/52 108/69 102/59  Pulse: 82 88 88 99  Temp: 97.9 F (36.6 C) 97.5 F (36.4 C) 97.5 F (36.4 C) 98.3 F (36.8 C)  TempSrc: Oral Axillary Axillary Axillary  Resp: 15 18 18 18   Height:      Weight:      SpO2: 94% 92% 91% 93%    Wt Readings from Last 3 Encounters:  11/15/13 53.207 kg (117 lb 4.8 oz)  03/09/13 72.4 kg (159 lb 9.8 oz)  02/13/13 72.9 kg (160 lb 11.5 oz)     Intake/Output Summary (Last 24 hours) at 11/16/13 1224 Last data filed at 11/16/13 1117  Gross per 24 hour  Intake 1052.5 ml  Output    400 ml  Net  652.5 ml    Exam  General: Well developed, malnourished  HEENT: NCAT, mucous membranes moist  Cardiovascular: S1 S2 auscultated, irregular  Respiratory: Diminished breath sounds  Abdomen: Soft, nontender, nondistended, +  bowel sounds  Extremities: warm dry without cyanosis clubbing or edema, contracted lower extremities, left knee bandage  Data Review   Micro Results Recent Results (from the past 240 hour(s))  MRSA PCR SCREENING     Status: None   Collection Time    11/15/13  1:52 PM      Result Value Ref Range Status   MRSA by PCR NEGATIVE  NEGATIVE Final   Comment:            The GeneXpert MRSA Assay (FDA     approved for NASAL specimens     only), is one component of a     comprehensive MRSA colonization     surveillance program. It is not     intended  to diagnose MRSA     infection nor to guide or     monitor treatment for     MRSA infections.  CULTURE, BLOOD (ROUTINE X 2)     Status: None   Collection Time    11/15/13  2:11 PM      Result Value Ref Range Status   Specimen Description BLOOD RIGHT HAND   Final   Special Requests BOTTLES DRAWN AEROBIC ONLY 6CC   Final   Culture NO GROWTH 1 DAY   Final   Report Status PENDING   Incomplete  CULTURE, BLOOD (ROUTINE X 2)     Status: None   Collection Time    11/15/13  2:14 PM      Result Value Ref Range Status   Specimen Description BLOOD PORTA CATH DRAWN BY RN [facility]   Final   Special Requests     Final   Value: BOTTLES DRAWN AEROBIC AND ANAEROBIC AEB=8CC ANA=6CC   Culture NO GROWTH 1 DAY   Final   Report Status PENDING   Incomplete    Radiology Reports Dg Knee 1-2 Views Left  10/28/2013   CLINICAL DATA:  Metal exposure.  EXAM: LEFT KNEE - 1-2 VIEW  COMPARISON:  None.  FINDINGS: There is a total knee arthroplasty with intact hardware. There is also an Enders rod in the tibia. I do not see any obvious soft tissue covering the femoral prosthesis. The knee is and hyperflexion.  IMPRESSION: Hyperflexion deformity of the knee.  No obvious soft tissues covering the knee prosthesis. It could be exposed.   Electronically Signed   By: Kalman Jewels M.D.   On: 11/16/2013 20:02   Ct Head Wo Contrast  10/23/2013   CLINICAL DATA:  81 year old female  with altered mental status. No known injury  EXAM: CT HEAD WITHOUT CONTRAST  TECHNIQUE: Contiguous axial images were obtained from the base of the skull through the vertex without intravenous contrast.  COMPARISON:  Brain MR 03/08/2013, prior head CT 03/04/2013  FINDINGS: Surgical changes of prior right parietal craniotomy. Otherwise, no acute bony abnormality of the calvarium. No aggressive calvarial lesion identified.  Frothy secretions of the sphenoid sinus with minimal mucosal thickening of ethmoid air cells.  Unremarkable appearance of the bilateral orbits.  Trace fluid of the left mastoid air cells.  No acute intracranial hemorrhage. No midline shift. No mass effect. Senescent volume loss of the brain, compatible with findings on prior MRI. Intracranial atherosclerotic changes. Ventricular system is unchanged in size and configuration from the prior MR and CT. Gray-white differentiation maintained in the supratentorial brain, with no evidence of acute ischemia.  There is 1 focus of hypodensity within the left brainstem at the level of the superior cerebellar peduncles (image 10 of series 3). This is in a region of streak artifact and motion, though was not present on comparison MR and CT.  IMPRESSION: There is a questionable new hypodense focus within the left side of the brainstem at the level of the superior cerebellar peduncle which is in a region of motion artifact and streak. If there is concern for acute abnormality, further evaluation with repeat MRI may be considered.  No evidence of acute intracranial hemorrhage, midline shift, or mass effect.  Senescent a brain volume loss.  Signed,  Dulcy Fanny. Earleen Newport, DO  [facility]  [facility]   Electronically Signed   By: Corrie Mckusick O.D.   On: 11/21/2013 15:25   Dg Chest Portable 1 View  11/21/2013  CLINICAL DATA:  Nonfunctioning right-sided PICC line; bilateral arm swelling ; on antibiotic therapy for chronic  knee infection ; altered mental status  EXAM: PORTABLE CHEST - 1 VIEW  COMPARISON:  Portable chest x-ray of 07 November 2013  FINDINGS: The left PICC line tip lies in overlies the midportion of the SVC. The cardiopericardial silhouette is enlarged. The pulmonary vascularity is mildly engorged and indistinct. The interstitial markings of both lungs are increased. The hemidiaphragms are obscured consistent with pleural effusions. The bony structures are osteopenic.  IMPRESSION: Congestive heart failure with pulmonary interstitial edema and bilateral pleural effusions. Certainly pneumonia cannot be excluded at the lung bases.   Electronically Signed   By: David  Martinique   On: 11/08/2013 11:41   Dg Chest Port 1 View  11/07/2013   CLINICAL DATA:  Left-sided PICC line placement  EXAM: PORTABLE CHEST - 1 VIEW  COMPARISON:  Portable chest x-ray of 03/12/2013  FINDINGS: The left PICC line coils in the innominate vein with the tip extending cephalad toward the lower left neck probably in the left internal jugular vein. No pneumothorax is seen. There is evidence of pulmonary vascular congestion with basilar atelectasis and probable small effusions as well. Cardiomegaly is stable.  IMPRESSION: The left PICC line coils in the innominate vein, with the tip extending cephalad probably within the left internal jugular vein.   Electronically Signed   By: Ivar Drape M.D.   On: 11/07/2013 12:29   Dg Chest Port 1v Same Day  11/07/2013   CLINICAL DATA:  Recent PICC line placement  EXAM: PORTABLE CHEST - 1 VIEW SAME DAY  COMPARISON:  Film from earlier in the same day  FINDINGS: The tip of the PICC line from the left side remains somewhat looped in the superior vena cava proximally. The cardiac shadow and left-sided pleural effusion are stable. No acute abnormality is noted.  IMPRESSION: PICC line remains looped in the superior vena cava. There is a plan for repositioning and reimaging prior to discharge   Electronically Signed    By: Inez Catalina M.D.   On: 11/07/2013 15:09   Dg Chest Port 1v Same Day  11/07/2013   CLINICAL DATA:  PICC line adjustment  EXAM: PORTABLE CHEST - 1 VIEW SAME DAY  COMPARISON:  11/07/2013 at 1214 hr  FINDINGS: The PICC line has been adjusted such that the catheter extends down the superior vena cava with the tip deflecting into the azygos vein over several mm.  Left pleural effusion and cardiomegaly again demonstrated. No pneumothorax  IMPRESSION: PICC line with tip extending down the superior vena cava with tip deflecting into the azygos vein. This may be repositioned itself into the SVC with use.   Electronically Signed   By: Suzy Bouchard M.D.   On: 11/07/2013 13:52    CBC  Recent Labs Lab 11/17/2013 1110 11/15/13 0555 11/15/13 1412 11/16/13 0526  WBC 10.3 9.2  --  9.8  HGB 8.4* 7.5* 7.6* 7.0*  HCT 25.9* 23.3* 23.7* 22.2*  PLT 557* 461*  --  390  MCV 88.4 89.3  --  90.6  MCH 28.7 28.7  --  28.6  MCHC 32.4 32.2  --  31.5  RDW 16.8* 17.3*  --  17.4*  LYMPHSABS 0.6*  --   --   --   MONOABS 0.4  --   --   --   EOSABS 0.0  --   --   --   BASOSABS 0.0  --   --   --  Chemistries   Recent Labs Lab 11/03/2013 1319 11/15/13 0555 11/16/13 0526  NA 140 141 141  K 4.3 4.1 4.1  CL 106 110 111  CO2 18* 19 18*  GLUCOSE 127* 92 100*  BUN 55* 55* 53*  CREATININE 2.44* 2.41* 2.41*  CALCIUM 8.2* 7.7* 7.6*  AST  --  10  --   ALT  --  8  --   ALKPHOS  --  30*  --   BILITOT  --  0.2*  --    ------------------------------------------------------------------------------------------------------------------ estimated creatinine clearance is 13.3 ml/min (by C-G formula based on Cr of 2.41). ------------------------------------------------------------------------------------------------------------------ No results found for this basename: HGBA1C,  in the last 72 hours ------------------------------------------------------------------------------------------------------------------ No  results found for this basename: CHOL, HDL, LDLCALC, TRIG, CHOLHDL, LDLDIRECT,  in the last 72 hours ------------------------------------------------------------------------------------------------------------------ No results found for this basename: TSH, T4TOTAL, FREET3, T3FREE, THYROIDAB,  in the last 72 hours ------------------------------------------------------------------------------------------------------------------  Recent Labs  11/15/13 0948  VITAMINB12 385  FOLATE 6.8  FERRITIN 611*  TIBC 125*  IRON 66  RETICCTPCT 2.3    Coagulation profile  Recent Labs Lab 11/15/13 0555  INR 1.99*    No results found for this basename: DDIMER,  in the last 72 hours  Cardiac Enzymes  Recent Labs Lab 11/20/2013 1319  TROPONINI <0.30   ------------------------------------------------------------------------------------------------------------------ No components found with this basename: POCBNP,     [name] D.O. on 11/16/2013 at 12:24 PM  Between 7am to 7pm - Pager - [phone]  After 7pm go to [email or web address] - password [email or web address]  And look for the night coverage person covering for me after hours  [facility] Office  [phone]

## 2013-11-16 NOTE — Clinical Social Work Note (Addendum)
CSW present at end of MD discussion with family. They will continue conversations and report no needs at this time. Brief support provided. [facility] is willing to accept pt at d/c. Family is unsure of d/c plans at this point.   Benay Pike, [place]

## 2013-11-16 NOTE — Progress Notes (Signed)
Patient temp at 99.1 at 2nd hour check for blood VS.  Increased from initial temp of 97.5 before blood started.  MD paged and made aware - OK to continue with transfusion.  No orders.  Will continue to monitor.  Patient appears to be tolerating transfusion with no issues.

## 2013-11-16 NOTE — Progress Notes (Signed)
  Echocardiogram 2D Echocardiogram has been performed.  [place], [place] 11/16/2013, 3:43 PM

## 2013-11-16 NOTE — Progress Notes (Signed)
Patient's meds held this morning d/t patient not following commands to swallow.  Attempted to give patient magic cup, but just appears to hold it in her mouth.  All PO meds held at this time.  Patient moaning and grimacing frequently.  PRN morphine and ativan given as ordered with some relief.  In process of administering blood at this time.  Telephone consent given by daughter - Joylene John with 2 nurse veruifications Jerrye Noble, RN & Marijean Bravo, RN).  Tolerating well so far.  Will continue to monitor.

## 2013-11-16 NOTE — Progress Notes (Signed)
Present with family and patient for spiritual and emotional support. Patient's daughter discussed decision about her mother's DNR status. Affirmed and listened as she discussed her mother's recent health decline. Daughter shared she was not yet ready to "give mother permission to let go". Yet she stated she is working towards that. Prayed with family.  Patient is not at a place to complete Advance Directives. Daughter shared her mother had discussed end of life issues with her.

## 2013-11-17 LAB — CBC
HCT: 31.1 % — ABNORMAL LOW (ref 36.0–46.0)
HEMOGLOBIN: 10.1 g/dL — AB (ref 12.0–15.0)
MCH: 28.8 pg (ref 26.0–34.0)
MCHC: 32.5 g/dL (ref 30.0–36.0)
MCV: 88.6 fL (ref 78.0–100.0)
PLATELETS: 277 10*3/uL (ref 150–400)
RBC: 3.51 MIL/uL — AB (ref 3.87–5.11)
RDW: 18.1 % — ABNORMAL HIGH (ref 11.5–15.5)
WBC: 9.4 10*3/uL (ref 4.0–10.5)

## 2013-11-17 LAB — BASIC METABOLIC PANEL
ANION GAP: 12 (ref 5–15)
BUN: 54 mg/dL — ABNORMAL HIGH (ref 6–23)
CALCIUM: 7.8 mg/dL — AB (ref 8.4–10.5)
CO2: 17 mEq/L — ABNORMAL LOW (ref 19–32)
Chloride: 113 mEq/L — ABNORMAL HIGH (ref 96–112)
Creatinine, Ser: 2.32 mg/dL — ABNORMAL HIGH (ref 0.50–1.10)
GFR calc Af Amer: 20 mL/min — ABNORMAL LOW (ref >90)
GFR, EST NON AFRICAN AMERICAN: 18 mL/min — AB (ref >90)
Glucose, Bld: 77 mg/dL (ref 70–99)
Potassium: 4.4 mEq/L (ref 3.7–5.3)
SODIUM: 142 meq/L (ref 137–147)

## 2013-11-17 LAB — TYPE AND SCREEN
ABO/RH(D): A POS
ANTIBODY SCREEN: NEGATIVE
Unit division: 0
Unit division: 0

## 2013-11-17 MED ORDER — SCOPOLAMINE 1 MG/3DAYS TD PT72
1.0000 | MEDICATED_PATCH | TRANSDERMAL | Status: DC
  Administered 2013-11-17: 1.5 mg via TRANSDERMAL
  Filled 2013-11-17: qty 1

## 2013-11-17 NOTE — Progress Notes (Signed)
[facility]                                                                              Patient Demographics  [name], is a 81 y.o. female, DOB - [birth year], MGN:[id number]  Admit date - 11/08/2013   Admitting Physician Doree Albee, MD  Outpatient Primary MD for the patient is Jani Gravel, MD  LOS - 3   Chief Complaint  Patient presents with  . Altered Mental Status      HPI on 11/16/2013 [name] is a 80 y.o. female with a history end-stage dementia and is bedbound presented with altered mental status that began approximately 2 hours prior to admission. Patient is a resident of a skilled nursing facility. The patient has been receiving intravenous Zyvox through a PICC line for pneumonia and has been increasingly lethargic in the last few days. The patient has lost approximately 70 pounds over the last 10 months.  She was admitted for further management as she was found to be clinically and biochemically dehydrated.   Assessment & Plan   Patient's family did decide to make her comfort measures only. Patient will be continued on morphine drip, as well as Ativan as needed and scopolamine patch.  Acute encephalopathy -Patient has a history of end-stage dementia however has been increasingly lethargic over the past few days -Currently patient is afebrile no leukocytosis -Was on Zyvox at the nursing home for suspected pneumonia -Patient also has an abrasion on her left knee, her knee replacement is currently showing however does not appear to be infected -blood cultures pending -UA showed 3-6 WBC, few bacteria, trace leukocytes -Chest x-ray: Congestive heart failure with pulmonary interstitial edema and bilateral pleural effusions, pneumonia cannot be excluded at the lung bases  Acute renal failure possibly secondary dehydration -Currently creatinine is 2.3 -initially given IVFluids, however discontinued due to diastolic CHF and third spacing -Renal US: No  evidence of renal mass or hydronephrosis, medical renal disease -FeNa 0.4%, prerenal -Will continue to monitor her urine output and BMP  Normocytic anemia -Anemia panel showed adequate iron as well as iron stores. -Hemoglobin 10 after receiving 2uPRBCs -Baseline hemoglobin approximately 9-16  Chronic Diastolic heart failure -Patient currently has an elevated BNP 2970 -Last echocardiogram in January 2015: EF of 94-50%, grade 1 diastolic dysfunction -Will continue to monitor intake and output -Echocardiogram EF 38-88%, grade 1 diastolic dysfunction, questionable physiology of tamponade -At this time patient does not appear to be volume overloaded  -Contact the patient's cardiologist, Dr. Rosalita Chessman, in [place], [place].  no recent echo has been conducted.  Atrial fibrillation on anticoagulation -Will hold Xarelto due to her anemia -Currently rate controlled -Spoke with patient's cardiologist who recommended discontinuing Zoloft though and using aspirin.  Hypertension -Will continue her amlodipine once able to swallow -Patient placed on hydralazine as needed  Malnutrition -Continue Marinol  Exposed hardware of left knee -Patient status post knee replacement years ago -Appears to have injured herself at the nursing home at which point her hardware has not been exposed -Orthopedics consulted appreciated, patient is not a surgical candidate at this time, recommended conservative management -Does not appear to be infected  Nonspecific pain -Patient continues  to moan and state she is in pain however very nonspecific. Will continue pain control as needed.  Code Status: DNR  Family Communication: Family at bedside.  Disposition Plan: Admitted.  Patient's family has opted to make patient comfort measures only.  Time Spent in minutes   25 minutes  Procedures  Echocardiogram Study Conclusions - Procedure narrative: Transthoracic echocardiography. Image quality was suboptimal. The study  was technically difficult, as a result of poor patient compliance and restricted patient mobility. - Left ventricle: The cavity size was normal. Wall thickness was increased in a pattern of mild LVH. Systolic function was vigorous. The estimated ejection fraction was in the range of 65% to 70%. Doppler parameters are consistent with abnormal left ventricular relaxation (grade 1 diastolic dysfunction). - Aortic valve: Mildly calcified annulus. Trileaflet; mildly thickened leaflets. - Mitral valve: Calcified annulus. Normal thickness leaflets . - Tricuspid valve: There was mild regurgitation. - Pulmonary arteries: PA peak pressure: 45 mm Hg (S). Mildly elevated pulmonary pressures. - Systemic veins: IVC normal in diameter, but with blunted resirophasic variation. Estimated CVP 8 mmHg. - Pericardium, extracardiac: Circumferential pericardial effusion noted, largest toward the RV free wall (large in size) and apex and adjacent to right atrium (moderate in size). Right atrial inversion is seen, without right ventricular collapse. There is borderline tamponade physiology.  Consults   Orthopedics  DVT Prophylaxis  SCDs  Lab Results  Component Value Date   PLT 277 11/17/2013    Medications  Scheduled Meds: . scopolamine  1 patch Transdermal Q72H   Continuous Infusions: . morphine 2 mg/hr (11/16/13 1957)   PRN Meds:.hydrALAZINE, ipratropium-albuterol, LORazepam, ondansetron (ZOFRAN) IV, ondansetron  Antibiotics    Anti-infectives   Start     Dose/Rate Route Frequency Ordered Stop   11/09/2013 2200  linezolid (ZYVOX) IVPB 600 mg  Status:  Discontinued     600 mg 300 mL/hr over 60 Minutes Intravenous Every 12 hours 11/01/2013 1618 11/15/13 1318        Subjective:   [name] seen and examined today. Patient currently resting. Does not appear to be in distress.  Objective:   Filed Vitals:   11/16/13 1625 11/16/13 2320 11/17/13 0620 11/17/13 0812  BP: 123/74 79/35 106/60     Pulse: 87 68 72   Temp: 97.7 F (36.5 C) 98.3 F (36.8 C) 97.8 F (36.6 C)   TempSrc: Axillary Axillary Axillary   Resp: 18 16 6 6   Height:      Weight:      SpO2: 95% 96% 94%     Wt Readings from Last 3 Encounters:  11/15/13 53.207 kg (117 lb 4.8 oz)  03/09/13 72.4 kg (159 lb 9.8 oz)  02/13/13 72.9 kg (160 lb 11.5 oz)     Intake/Output Summary (Last 24 hours) at 11/17/13 1421 Last data filed at 11/17/13 4709  Gross per 24 hour  Intake  21.27 ml  Output    750 ml  Net -728.73 ml    Exam  General: Well developed, malnourished  HEENT: NCAT, mucous membranes moist  Cardiovascular: S1 S2 auscultated, irregular  Respiratory: Diminished breath sounds  Abdomen: Soft, nontender, nondistended, + bowel sounds  Extremities: warm dry without cyanosis clubbing, contracted lower extremities, left knee bandage  Data Review   Micro Results Recent Results (from the past 240 hour(s))  MRSA PCR SCREENING     Status: None   Collection Time    11/15/13  1:52 PM      Result Value Ref Range Status  MRSA by PCR NEGATIVE  NEGATIVE Final   Comment:            The GeneXpert MRSA Assay (FDA     approved for NASAL specimens     only), is one component of a     comprehensive MRSA colonization     surveillance program. It is not     intended to diagnose MRSA     infection nor to guide or     monitor treatment for     MRSA infections.  CULTURE, BLOOD (ROUTINE X 2)     Status: None   Collection Time    11/15/13  2:11 PM      Result Value Ref Range Status   Specimen Description BLOOD RIGHT HAND   Final   Special Requests BOTTLES DRAWN AEROBIC ONLY 6CC   Final   Culture NO GROWTH 2 DAYS   Final   Report Status PENDING   Incomplete  CULTURE, BLOOD (ROUTINE X 2)     Status: None   Collection Time    11/15/13  2:14 PM      Result Value Ref Range Status   Specimen Description BLOOD PORTA CATH DRAWN BY RN [facility]   Final   Special Requests     Final   Value: BOTTLES DRAWN AEROBIC AND  ANAEROBIC AEB=8CC ANA=6CC   Culture NO GROWTH 2 DAYS   Final   Report Status PENDING   Incomplete    Radiology Reports Dg Knee 1-2 Views Left  11/08/2013   CLINICAL DATA:  Metal exposure.  EXAM: LEFT KNEE - 1-2 VIEW  COMPARISON:  None.  FINDINGS: There is a total knee arthroplasty with intact hardware. There is also an Enders rod in the tibia. I do not see any obvious soft tissue covering the femoral prosthesis. The knee is and hyperflexion.  IMPRESSION: Hyperflexion deformity of the knee.  No obvious soft tissues covering the knee prosthesis. It could be exposed.   Electronically Signed   By: Kalman Jewels M.D.   On: 11/12/2013 20:02   Ct Head Wo Contrast  10/29/2013   CLINICAL DATA:  80 year old female with altered mental status. No known injury  EXAM: CT HEAD WITHOUT CONTRAST  TECHNIQUE: Contiguous axial images were obtained from the base of the skull through the vertex without intravenous contrast.  COMPARISON:  Brain MR 03/08/2013, prior head CT 03/04/2013  FINDINGS: Surgical changes of prior right parietal craniotomy. Otherwise, no acute bony abnormality of the calvarium. No aggressive calvarial lesion identified.  Frothy secretions of the sphenoid sinus with minimal mucosal thickening of ethmoid air cells.  Unremarkable appearance of the bilateral orbits.  Trace fluid of the left mastoid air cells.  No acute intracranial hemorrhage. No midline shift. No mass effect. Senescent volume loss of the brain, compatible with findings on prior MRI. Intracranial atherosclerotic changes. Ventricular system is unchanged in size and configuration from the prior MR and CT. Gray-white differentiation maintained in the supratentorial brain, with no evidence of acute ischemia.  There is 1 focus of hypodensity within the left brainstem at the level of the superior cerebellar peduncles (image 10 of series 3). This is in a region of streak artifact and motion, though was not present on comparison MR and CT.   IMPRESSION: There is a questionable new hypodense focus within the left side of the brainstem at the level of the superior cerebellar peduncle which is in a region of motion artifact and streak. If there is concern for acute abnormality, further  evaluation with repeat MRI may be considered.  No evidence of acute intracranial hemorrhage, midline shift, or mass effect.  Senescent a brain volume loss.  Signed,  Dulcy Fanny. Earleen Newport, DO  [facility]  [facility]   Electronically Signed   By: Corrie Mckusick O.D.   On: 10/29/2013 15:25   Dg Chest Portable 1 View  10/31/2013   CLINICAL DATA:  Nonfunctioning right-sided PICC line; bilateral arm swelling ; on antibiotic therapy for chronic knee infection ; altered mental status  EXAM: PORTABLE CHEST - 1 VIEW  COMPARISON:  Portable chest x-ray of 07 November 2013  FINDINGS: The left PICC line tip lies in overlies the midportion of the SVC. The cardiopericardial silhouette is enlarged. The pulmonary vascularity is mildly engorged and indistinct. The interstitial markings of both lungs are increased. The hemidiaphragms are obscured consistent with pleural effusions. The bony structures are osteopenic.  IMPRESSION: Congestive heart failure with pulmonary interstitial edema and bilateral pleural effusions. Certainly pneumonia cannot be excluded at the lung bases.   Electronically Signed   By: David  Martinique   On: 11/16/2013 11:41   Dg Chest Port 1 View  11/07/2013   CLINICAL DATA:  Left-sided PICC line placement  EXAM: PORTABLE CHEST - 1 VIEW  COMPARISON:  Portable chest x-ray of 03/12/2013  FINDINGS: The left PICC line coils in the innominate vein with the tip extending cephalad toward the lower left neck probably in the left internal jugular vein. No pneumothorax is seen. There is evidence of pulmonary vascular congestion with basilar atelectasis and probable small effusions as well. Cardiomegaly is stable.  IMPRESSION: The left  PICC line coils in the innominate vein, with the tip extending cephalad probably within the left internal jugular vein.   Electronically Signed   By: Ivar Drape M.D.   On: 11/07/2013 12:29   Dg Chest Port 1v Same Day  11/07/2013   CLINICAL DATA:  Recent PICC line placement  EXAM: PORTABLE CHEST - 1 VIEW SAME DAY  COMPARISON:  Film from earlier in the same day  FINDINGS: The tip of the PICC line from the left side remains somewhat looped in the superior vena cava proximally. The cardiac shadow and left-sided pleural effusion are stable. No acute abnormality is noted.  IMPRESSION: PICC line remains looped in the superior vena cava. There is a plan for repositioning and reimaging prior to discharge   Electronically Signed   By: Inez Catalina M.D.   On: 11/07/2013 15:09   Dg Chest Port 1v Same Day  11/07/2013   CLINICAL DATA:  PICC line adjustment  EXAM: PORTABLE CHEST - 1 VIEW SAME DAY  COMPARISON:  11/07/2013 at 1214 hr  FINDINGS: The PICC line has been adjusted such that the catheter extends down the superior vena cava with the tip deflecting into the azygos vein over several mm.  Left pleural effusion and cardiomegaly again demonstrated. No pneumothorax  IMPRESSION: PICC line with tip extending down the superior vena cava with tip deflecting into the azygos vein. This may be repositioned itself into the SVC with use.   Electronically Signed   By: Suzy Bouchard M.D.   On: 11/07/2013 13:52    CBC  Recent Labs Lab 10/31/2013 1110 11/15/13 0555 11/15/13 1412 11/16/13 0526 11/17/13 0605  WBC 10.3 9.2  --  9.8 9.4  HGB 8.4* 7.5* 7.6* 7.0* 10.1*  HCT 25.9* 23.3* 23.7* 22.2* 31.1*  PLT 557* 461*  --  390 277  MCV 88.4 89.3  --  90.6 88.6  MCH 28.7 28.7  --  28.6 28.8  MCHC 32.4 32.2  --  31.5 32.5  RDW 16.8* 17.3*  --  17.4* 18.1*  LYMPHSABS 0.6*  --   --   --   --   MONOABS 0.4  --   --   --   --   EOSABS 0.0  --   --   --   --   BASOSABS 0.0  --   --   --   --     Chemistries   Recent  Labs Lab 11/18/2013 1319 11/15/13 0555 11/16/13 0526 11/17/13 0605  NA 140 141 141 142  K 4.3 4.1 4.1 4.4  CL 106 110 111 113*  CO2 18* 19 18* 17*  GLUCOSE 127* 92 100* 77  BUN 55* 55* 53* 54*  CREATININE 2.44* 2.41* 2.41* 2.32*  CALCIUM 8.2* 7.7* 7.6* 7.8*  AST  --  10  --   --   ALT  --  8  --   --   ALKPHOS  --  30*  --   --   BILITOT  --  0.2*  --   --    ------------------------------------------------------------------------------------------------------------------ estimated creatinine clearance is 13.8 ml/min (by C-G formula based on Cr of 2.32). ------------------------------------------------------------------------------------------------------------------ No results found for this basename: HGBA1C,  in the last 72 hours ------------------------------------------------------------------------------------------------------------------ No results found for this basename: CHOL, HDL, LDLCALC, TRIG, CHOLHDL, LDLDIRECT,  in the last 72 hours ------------------------------------------------------------------------------------------------------------------ No results found for this basename: TSH, T4TOTAL, FREET3, T3FREE, THYROIDAB,  in the last 72 hours ------------------------------------------------------------------------------------------------------------------  Recent Labs  11/15/13 0948  VITAMINB12 385  FOLATE 6.8  FERRITIN 611*  TIBC 125*  IRON 66  RETICCTPCT 2.3    Coagulation profile  Recent Labs Lab 11/15/13 0555  INR 1.99*    No results found for this basename: DDIMER,  in the last 72 hours  Cardiac Enzymes  Recent Labs Lab 11/15/2013 1319  TROPONINI <0.30   ------------------------------------------------------------------------------------------------------------------ No components found with this basename: POCBNP,     Laird Runnion D.O. on 11/17/2013 at 2:21 PM  Between 7am to 7pm - Pager - [phone]  After 7pm go to [email or web address]  - password [email or web address]  And look for the night coverage person covering for me after hours  [facility] Office  [phone]

## 2013-11-17 NOTE — Progress Notes (Addendum)
Patient resting well at this time with morphine drip at 2.  RR - 6-8 per minute with long periods of apnea.  Patient opens eyes to voice and touch at times, but does not speak.  MD paged to update, orders given to DC PO meds.  Will call and update family as well.

## 2013-11-17 NOTE — Progress Notes (Signed)
Attempted to call daughter to update on patient status, but phone goes straight to voicemail.  Will attempt to call again later this morning

## 2013-11-18 MED ORDER — ATROPINE SULFATE 1 % OP SOLN
2.0000 [drp] | Freq: Three times a day (TID) | OPHTHALMIC | Status: DC
  Administered 2013-11-18: 2 [drp] via SUBLINGUAL
  Filled 2013-11-18: qty 2

## 2013-11-18 NOTE — Progress Notes (Signed)
[facility]                                                                              Patient Demographics  [name], is a 80 y.o. female, DOB - 08-23-1922, EZM:[id number]  Admit date - 11/01/2013   Admitting Physician Doree Albee, MD  Outpatient Primary MD for the patient is Jani Gravel, MD  LOS - 4   Chief Complaint  Patient presents with  . Altered Mental Status      HPI on 11/11/2013 [name] is a 80 y.o. female with a history end-stage dementia and is bedbound presented with altered mental status that began approximately 2 hours prior to admission. Patient is a resident of a skilled nursing facility. The patient has been receiving intravenous Zyvox through a PICC line for pneumonia and has been increasingly lethargic in the last few days. The patient has lost approximately 70 pounds over the last 10 months.  She was admitted for further management as she was found to be clinically and biochemically dehydrated.   Assessment & Plan   Patient's family did decide to make her comfort measures only. Patient will be continued on morphine drip, as well as Ativan as needed and scopolamine patch.  Acute encephalopathy -Patient has a history of end-stage dementia however has been increasingly lethargic over the past few days -Currently patient is afebrile no leukocytosis -Was on Zyvox at the nursing home for suspected pneumonia -Patient also has an abrasion on her left knee, her knee replacement is currently showing however does not appear to be infected -blood cultures pending -UA showed 3-6 WBC, few bacteria, trace leukocytes -Chest x-ray: Congestive heart failure with pulmonary interstitial edema and bilateral pleural effusions, pneumonia cannot be excluded at the lung bases  Acute renal failure possibly secondary dehydration -Currently creatinine is 2.3 -initially given IVFluids, however discontinued due to diastolic CHF and third spacing -Renal US: No  evidence of renal mass or hydronephrosis, medical renal disease -FeNa 0.4%, prerenal -Will continue to monitor her urine output and BMP  Normocytic anemia -Anemia panel showed adequate iron as well as iron stores. -Hemoglobin 10 after receiving 2uPRBCs -Baseline hemoglobin approximately 5-03  Chronic Diastolic heart failure -Patient currently has an elevated BNP 2970 -Last echocardiogram in January 2015: EF of 54-65%, grade 1 diastolic dysfunction -Will continue to monitor intake and output -Echocardiogram EF 68-12%, grade 1 diastolic dysfunction, questionable physiology of tamponade -At this time patient does not appear to be volume overloaded  -Contact the patient's cardiologist, Dr. Rosalita Chessman, in [place], [place].  no recent echo has been conducted.  Atrial fibrillation on anticoagulation -Will hold Xarelto due to her anemia -Currently rate controlled -Spoke with patient's cardiologist who recommended discontinuing Zoloft though and using aspirin.  Hypertension -Will continue her amlodipine once able to swallow -Patient placed on hydralazine as needed  Malnutrition -Continue Marinol  Exposed hardware of left knee -Patient status post knee replacement years ago -Appears to have injured herself at the nursing home at which point her hardware has not been exposed -Orthopedics consulted appreciated, patient is not a surgical candidate at this time, recommended conservative management -Does not appear to be infected  Nonspecific pain -Patient continues  to moan and state she is in pain however very nonspecific. Will continue pain control as needed.  Code Status: DNR  Family Communication: Family at bedside.  Disposition Plan: Admitted.  Patient's family has opted to make patient comfort measures only.  Time Spent in minutes   25 minutes  Procedures  Echocardiogram Study Conclusions - Procedure narrative: Transthoracic echocardiography. Image quality was suboptimal. The study  was technically difficult, as a result of poor patient compliance and restricted patient mobility. - Left ventricle: The cavity size was normal. Wall thickness was increased in a pattern of mild LVH. Systolic function was vigorous. The estimated ejection fraction was in the range of 65% to 70%. Doppler parameters are consistent with abnormal left ventricular relaxation (grade 1 diastolic dysfunction). - Aortic valve: Mildly calcified annulus. Trileaflet; mildly thickened leaflets. - Mitral valve: Calcified annulus. Normal thickness leaflets . - Tricuspid valve: There was mild regurgitation. - Pulmonary arteries: PA peak pressure: 45 mm Hg (S). Mildly elevated pulmonary pressures. - Systemic veins: IVC normal in diameter, but with blunted resirophasic variation. Estimated CVP 8 mmHg. - Pericardium, extracardiac: Circumferential pericardial effusion noted, largest toward the RV free wall (large in size) and apex and adjacent to right atrium (moderate in size). Right atrial inversion is seen, without right ventricular collapse. There is borderline tamponade physiology.  Consults   Orthopedics  DVT Prophylaxis  SCDs  Lab Results  Component Value Date   PLT 277 11/17/2013    Medications  Scheduled Meds: . scopolamine  1 patch Transdermal Q72H   Continuous Infusions: . morphine 3 mg/hr (11/18/13 0828)   PRN Meds:.hydrALAZINE, ipratropium-albuterol, LORazepam, ondansetron (ZOFRAN) IV, ondansetron  Antibiotics    Anti-infectives   Start     Dose/Rate Route Frequency Ordered Stop   11/18/2013 2200  linezolid (ZYVOX) IVPB 600 mg  Status:  Discontinued     600 mg 300 mL/hr over 60 Minutes Intravenous Every 12 hours 10/24/2013 1618 11/15/13 1318        Subjective:   [name] seen and examined today. Patient currently resting. Does not appear to be in distress.  Objective:   Filed Vitals:   11/17/13 0620 11/17/13 0812 11/17/13 1501 11/18/13 0700  BP: 106/60  103/39 94/29    Pulse: 72  57 55  Temp: 97.8 F (36.6 C)     TempSrc: Axillary     Resp: 6 6 5 8   Height:      Weight:      SpO2: 94%  95% 88%    Wt Readings from Last 3 Encounters:  11/15/13 53.207 kg (117 lb 4.8 oz)  03/09/13 72.4 kg (159 lb 9.8 oz)  02/13/13 72.9 kg (160 lb 11.5 oz)     Intake/Output Summary (Last 24 hours) at 11/18/13 1406 Last data filed at 11/18/13 1607  Gross per 24 hour  Intake  47.94 ml  Output      0 ml  Net  47.94 ml    Exam  General: Well developed, malnourished  Cardiovascular: S1 S2 auscultated, irregular  Respiratory: Shallow breathing, hypopnea   Data Review   Micro Results Recent Results (from the past 240 hour(s))  MRSA PCR SCREENING     Status: None   Collection Time    11/15/13  1:52 PM      Result Value Ref Range Status   MRSA by PCR NEGATIVE  NEGATIVE Final   Comment:            The GeneXpert MRSA Assay (FDA  approved for NASAL specimens     only), is one component of a     comprehensive MRSA colonization     surveillance program. It is not     intended to diagnose MRSA     infection nor to guide or     monitor treatment for     MRSA infections.  CULTURE, BLOOD (ROUTINE X 2)     Status: None   Collection Time    11/15/13  2:11 PM      Result Value Ref Range Status   Specimen Description BLOOD RIGHT HAND   Final   Special Requests BOTTLES DRAWN AEROBIC ONLY 6CC   Final   Culture NO GROWTH 3 DAYS   Final   Report Status PENDING   Incomplete  CULTURE, BLOOD (ROUTINE X 2)     Status: None   Collection Time    11/15/13  2:14 PM      Result Value Ref Range Status   Specimen Description BLOOD PORTA CATH DRAWN BY RN [facility]   Final   Special Requests     Final   Value: BOTTLES DRAWN AEROBIC AND ANAEROBIC AEB=8CC ANA=6CC   Culture NO GROWTH 3 DAYS   Final   Report Status PENDING   Incomplete    Radiology Reports Dg Knee 1-2 Views Left  10/31/2013   CLINICAL DATA:  Metal exposure.  EXAM: LEFT KNEE - 1-2 VIEW  COMPARISON:  None.   FINDINGS: There is a total knee arthroplasty with intact hardware. There is also an Enders rod in the tibia. I do not see any obvious soft tissue covering the femoral prosthesis. The knee is and hyperflexion.  IMPRESSION: Hyperflexion deformity of the knee.  No obvious soft tissues covering the knee prosthesis. It could be exposed.   Electronically Signed   By: Kalman Jewels M.D.   On: 10/24/2013 20:02   Ct Head Wo Contrast  11/03/2013   CLINICAL DATA:  80 year old female with altered mental status. No known injury  EXAM: CT HEAD WITHOUT CONTRAST  TECHNIQUE: Contiguous axial images were obtained from the base of the skull through the vertex without intravenous contrast.  COMPARISON:  Brain MR 03/08/2013, prior head CT 03/04/2013  FINDINGS: Surgical changes of prior right parietal craniotomy. Otherwise, no acute bony abnormality of the calvarium. No aggressive calvarial lesion identified.  Frothy secretions of the sphenoid sinus with minimal mucosal thickening of ethmoid air cells.  Unremarkable appearance of the bilateral orbits.  Trace fluid of the left mastoid air cells.  No acute intracranial hemorrhage. No midline shift. No mass effect. Senescent volume loss of the brain, compatible with findings on prior MRI. Intracranial atherosclerotic changes. Ventricular system is unchanged in size and configuration from the prior MR and CT. Gray-white differentiation maintained in the supratentorial brain, with no evidence of acute ischemia.  There is 1 focus of hypodensity within the left brainstem at the level of the superior cerebellar peduncles (image 10 of series 3). This is in a region of streak artifact and motion, though was not present on comparison MR and CT.  IMPRESSION: There is a questionable new hypodense focus within the left side of the brainstem at the level of the superior cerebellar peduncle which is in a region of motion artifact and streak. If there is concern for acute abnormality, further  evaluation with repeat MRI may be considered.  No evidence of acute intracranial hemorrhage, midline shift, or mass effect.  Senescent a brain volume loss.  Signed,  Dulcy Fanny.  Earleen Newport, DO  [facility]  [facility]   Electronically Signed   By: Corrie Mckusick O.D.   On: 11/21/2013 15:25   Dg Chest Portable 1 View  11/11/2013   CLINICAL DATA:  Nonfunctioning right-sided PICC line; bilateral arm swelling ; on antibiotic therapy for chronic knee infection ; altered mental status  EXAM: PORTABLE CHEST - 1 VIEW  COMPARISON:  Portable chest x-ray of 07 November 2013  FINDINGS: The left PICC line tip lies in overlies the midportion of the SVC. The cardiopericardial silhouette is enlarged. The pulmonary vascularity is mildly engorged and indistinct. The interstitial markings of both lungs are increased. The hemidiaphragms are obscured consistent with pleural effusions. The bony structures are osteopenic.  IMPRESSION: Congestive heart failure with pulmonary interstitial edema and bilateral pleural effusions. Certainly pneumonia cannot be excluded at the lung bases.   Electronically Signed   By: David  Martinique   On: 11/08/2013 11:41   Dg Chest Port 1 View  11/07/2013   CLINICAL DATA:  Left-sided PICC line placement  EXAM: PORTABLE CHEST - 1 VIEW  COMPARISON:  Portable chest x-ray of 03/12/2013  FINDINGS: The left PICC line coils in the innominate vein with the tip extending cephalad toward the lower left neck probably in the left internal jugular vein. No pneumothorax is seen. There is evidence of pulmonary vascular congestion with basilar atelectasis and probable small effusions as well. Cardiomegaly is stable.  IMPRESSION: The left PICC line coils in the innominate vein, with the tip extending cephalad probably within the left internal jugular vein.   Electronically Signed   By: Ivar Drape M.D.   On: 11/07/2013 12:29   Dg Chest Port 1v Same Day  11/07/2013   CLINICAL  DATA:  Recent PICC line placement  EXAM: PORTABLE CHEST - 1 VIEW SAME DAY  COMPARISON:  Film from earlier in the same day  FINDINGS: The tip of the PICC line from the left side remains somewhat looped in the superior vena cava proximally. The cardiac shadow and left-sided pleural effusion are stable. No acute abnormality is noted.  IMPRESSION: PICC line remains looped in the superior vena cava. There is a plan for repositioning and reimaging prior to discharge   Electronically Signed   By: Inez Catalina M.D.   On: 11/07/2013 15:09   Dg Chest Port 1v Same Day  11/07/2013   CLINICAL DATA:  PICC line adjustment  EXAM: PORTABLE CHEST - 1 VIEW SAME DAY  COMPARISON:  11/07/2013 at 1214 hr  FINDINGS: The PICC line has been adjusted such that the catheter extends down the superior vena cava with the tip deflecting into the azygos vein over several mm.  Left pleural effusion and cardiomegaly again demonstrated. No pneumothorax  IMPRESSION: PICC line with tip extending down the superior vena cava with tip deflecting into the azygos vein. This may be repositioned itself into the SVC with use.   Electronically Signed   By: Suzy Bouchard M.D.   On: 11/07/2013 13:52    CBC  Recent Labs Lab 11/21/2013 1110 11/15/13 0555 11/15/13 1412 11/16/13 0526 11/17/13 0605  WBC 10.3 9.2  --  9.8 9.4  HGB 8.4* 7.5* 7.6* 7.0* 10.1*  HCT 25.9* 23.3* 23.7* 22.2* 31.1*  PLT 557* 461*  --  390 277  MCV 88.4 89.3  --  90.6 88.6  MCH 28.7 28.7  --  28.6 28.8  MCHC 32.4 32.2  --  31.5 32.5  RDW 16.8* 17.3*  --  17.4* 18.1*  LYMPHSABS 0.6*  --   --   --   --   MONOABS 0.4  --   --   --   --   EOSABS 0.0  --   --   --   --   BASOSABS 0.0  --   --   --   --     Chemistries   Recent Labs Lab 11/09/2013 1319 11/15/13 0555 11/16/13 0526 11/17/13 0605  NA 140 141 141 142  K 4.3 4.1 4.1 4.4  CL 106 110 111 113*  CO2 18* 19 18* 17*  GLUCOSE 127* 92 100* 77  BUN 55* 55* 53* 54*  CREATININE 2.44* 2.41* 2.41* 2.32*    CALCIUM 8.2* 7.7* 7.6* 7.8*  AST  --  10  --   --   ALT  --  8  --   --   ALKPHOS  --  30*  --   --   BILITOT  --  0.2*  --   --    ------------------------------------------------------------------------------------------------------------------ estimated creatinine clearance is 13.8 ml/min (by C-G formula based on Cr of 2.32). ------------------------------------------------------------------------------------------------------------------ No results found for this basename: HGBA1C,  in the last 72 hours ------------------------------------------------------------------------------------------------------------------ No results found for this basename: CHOL, HDL, LDLCALC, TRIG, CHOLHDL, LDLDIRECT,  in the last 72 hours ------------------------------------------------------------------------------------------------------------------ No results found for this basename: TSH, T4TOTAL, FREET3, T3FREE, THYROIDAB,  in the last 72 hours ------------------------------------------------------------------------------------------------------------------ No results found for this basename: VITAMINB12, FOLATE, FERRITIN, TIBC, IRON, RETICCTPCT,  in the last 72 hours  Coagulation profile  Recent Labs Lab 11/15/13 0555  INR 1.99*    No results found for this basename: DDIMER,  in the last 72 hours  Cardiac Enzymes  Recent Labs Lab 10/31/2013 1319  TROPONINI <0.30   ------------------------------------------------------------------------------------------------------------------ No components found with this basename: POCBNP,     Hagop Mccollam D.O. on 11/18/2013 at 2:06 PM  Between 7am to 7pm - Pager - [phone]  After 7pm go to [email or web address] - password [email or web address]  And look for the night coverage person covering for me after hours  [facility] Office  [phone]

## 2013-11-20 LAB — CULTURE, BLOOD (ROUTINE X 2)
Culture: NO GROWTH
Culture: NO GROWTH

## 2013-11-20 NOTE — Progress Notes (Signed)
UR chart review completed.  

## 2013-11-22 NOTE — Progress Notes (Signed)
Patient passed away at [place], verified and listened to by two nurses. Family still at bedside, MD paged, will call [facility] and ask family about arrangements when ready. William Hamburger RN

## 2013-11-22 NOTE — Discharge Summary (Signed)
Death Summary  [name] JQB:[id number] DOB: January 10, 1923 DOA: November 21, 2013  PCP: Jani Gravel, MD PCP/Office notified:   Admit date: Nov 21, 2013 Date of Death: 2013/11/26  Final Diagnoses:  Acute encephalopathy Acute renal failure possibly secondary dehydration Normocytic anemia Chronic diastolic heart failure Atrial fibrillation on anticoagulation Hypertension Malnutrition Exposed hardware the left knee Nonspecific pain  History of present illness:  on 2013/11/21  [name] is a 80 y.o. female with a history end-stage dementia and is bedbound presented with altered mental status that began approximately 2 hours prior to admission. Patient is a resident of a skilled nursing facility. The patient has been receiving intravenous Zyvox through a PICC line for pneumonia and has been increasingly lethargic in the last few days. The patient has lost approximately 70 pounds over the last 10 months.  She was admitted for further management as she was found to be clinically and biochemically dehydrated.  Hospital Course:  This is an unfortunate 80 year old female with history of end-stage dementia that presents emergency department with acute encephalopathy. Patient also was noted to have worsening acute renal failure. Despite giving IV fluids as well as blood transfusion, her acute kidney injury did not improve. Patient was seen third spacing, and also had borderline tympanotomies physiology on echocardiogram. It was decided to make patient comfortable.  She was transitioned to morphine drip as well as place on Ativan as needed, given atropine drops and scopolamine patch.  Patient passed away on 2013-11-26 at 12:05 AM. Family was at bedside.  Acute encephalopathy  -Patient has a history of end-stage dementia however has been increasingly lethargic over the past few days  -Currently patient is afebrile no leukocytosis  -Was on Zyvox at the nursing home for suspected pneumonia  -Patient also  has an abrasion on her left knee, her knee replacement is currently showing however does not appear to be infected  -blood cultures pending  -UA showed 3-6 WBC, few bacteria, trace leukocytes  -Chest x-ray: Congestive heart failure with pulmonary interstitial edema and bilateral pleural effusions, pneumonia cannot be excluded at the lung bases  Acute renal failure possibly secondary dehydration  -Currently creatinine is 2.3  -initially given IVFluids, however discontinued due to diastolic CHF and third spacing  -Renal US: No evidence of renal mass or hydronephrosis, medical renal disease  -FeNa 0.4%, prerenal  -Will continue to monitor her urine output and BMP  Normocytic anemia  -Anemia panel showed adequate iron as well as iron stores.  -Hemoglobin 10 after receiving 2uPRBCs  -Baseline hemoglobin approximately 4-09  Chronic Diastolic heart failure  -Patient currently has an elevated BNP 2970  -Last echocardiogram in January 2015: EF of 73-53%, grade 1 diastolic dysfunction  -Will continue to monitor intake and output  -Echocardiogram EF 29-92%, grade 1 diastolic dysfunction, questionable physiology of tamponade  -At this time patient does not appear to be volume overloaded  -Contact the patient's cardiologist, Dr. Rosalita Chessman, in [place], [place]. no recent echo has been conducted.  Atrial fibrillation on anticoagulation  -Will hold Xarelto due to her anemia  -Currently rate controlled  -Spoke with patient's cardiologist who recommended discontinuing Zoloft though and using aspirin.  Hypertension  -Will continue her amlodipine once able to swallow  -Patient placed on hydralazine as needed  Malnutrition  -Continue Marinol  Exposed hardware of left knee  -Patient status post knee replacement years ago  -Appears to have injured herself at the nursing home at which point her hardware has not been exposed  -Orthopedics consulted appreciated, patient is  not a surgical candidate at this time,  recommended conservative management  -Does not appear to be infected  Nonspecific pain  -Patient continues to moan and state she is in pain however very nonspecific. Will continue pain control as needed.   Time: 10 minutes  Signed:  Cristal Ford  [facility] 11/30/13, 3:00 PM

## 2013-11-22 DEATH — deceased

## 2014-01-04 NOTE — Progress Notes (Signed)
Unable to find scanned documents in EMR.[facility] called again to refax documents. Will send to be scanned.

## 2014-01-04 NOTE — Addendum Note (Signed)
Encounter addended by: Naaman Plummer, RN on: 01/04/2014 11:39 AM<BR>     Documentation filed: PRL Based Order Sets, Orders, Clinical Notes

## 2015-01-16 IMAGING — CT CT HEAD W/O CM
4 of 10 series · 14 of 47 positions shown, 15 images · non-contrast
Comparison: None.

CLINICAL DATA: Fall

EXAM:
CT HEAD WITHOUT CONTRAST
CT CERVICAL SPINE WITHOUT CONTRAST
TECHNIQUE: Multidetector CT imaging of the head and cervical spine was
performed following the standard protocol without intravenous
contrast. Multiplanar CT image reconstructions of the cervical spine
were also generated.

[Series 2: headseq 4.8 h37s · axial · 0.47mm/px · z∈[+277,+327]mm · 2 of 30 slices shown, 3 images]
[im 10/30  brain]
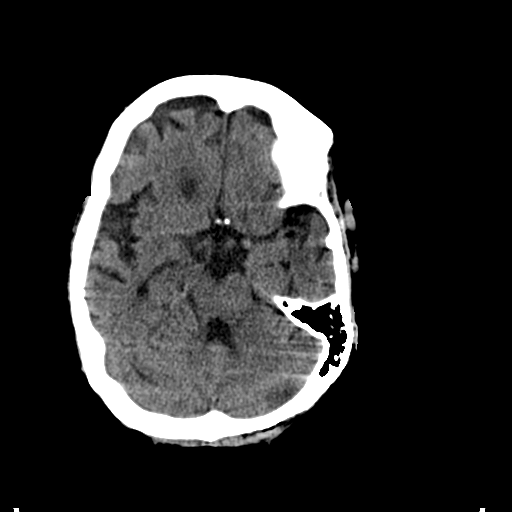
[im 10/30  bone]
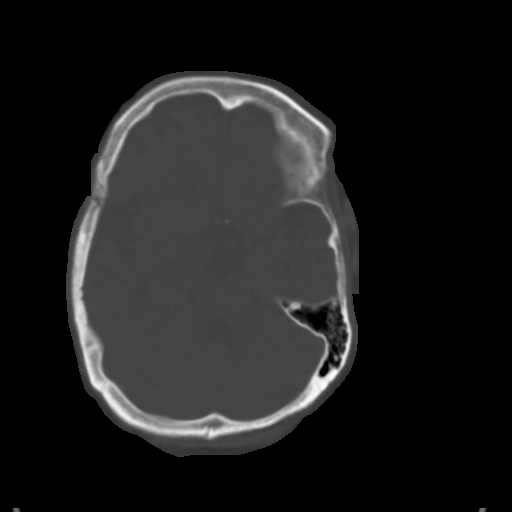
[im 20/30  brain]
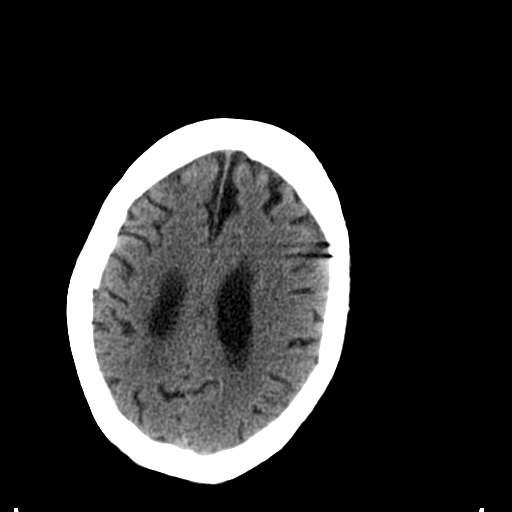

[Series 12: sagittal bone 2.0 · sagittal · 0.22mm/px · 2 of 46 slices shown]
[im 16/46  brain]
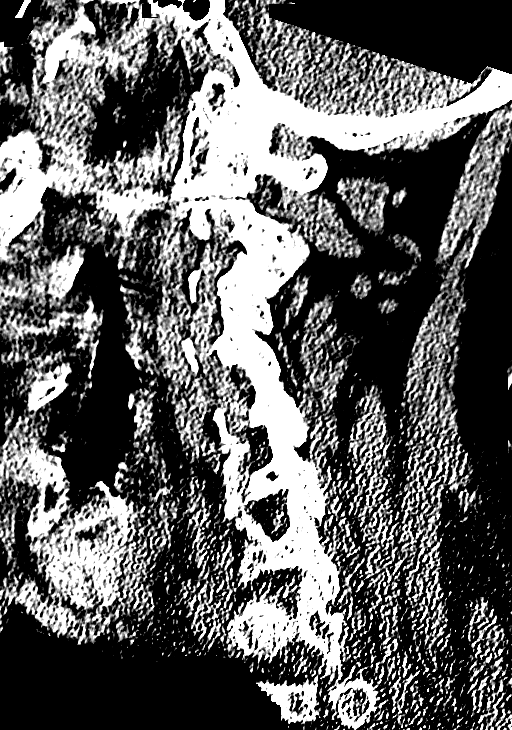
[im 31/46  brain]
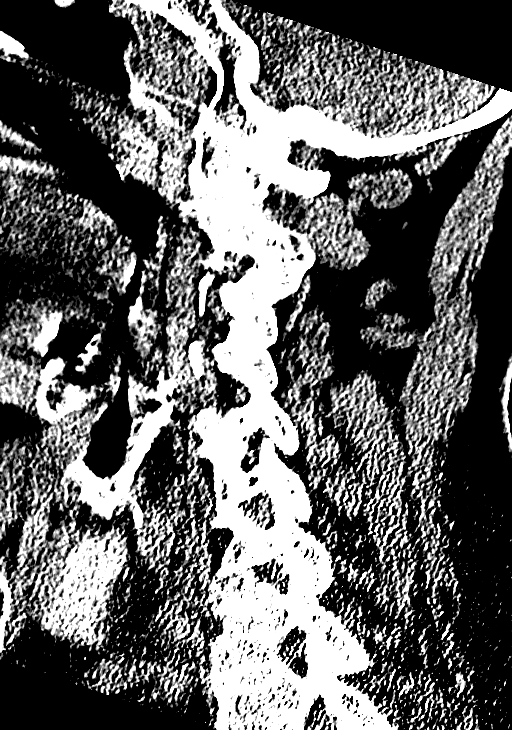

[Series 14: coronal bone 2.0 · coronal · 0.19mm/px · 3 of 53 slices shown]
[im 11/53  brain]
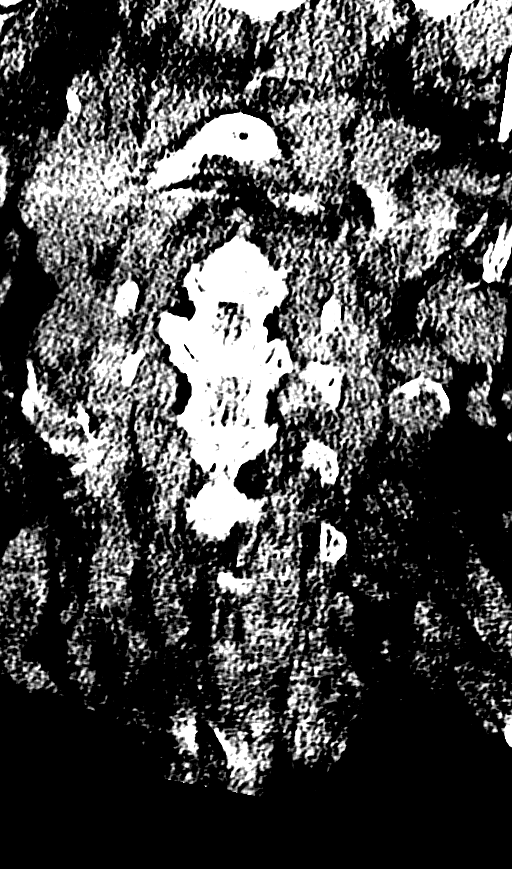
[im 21/53  brain]
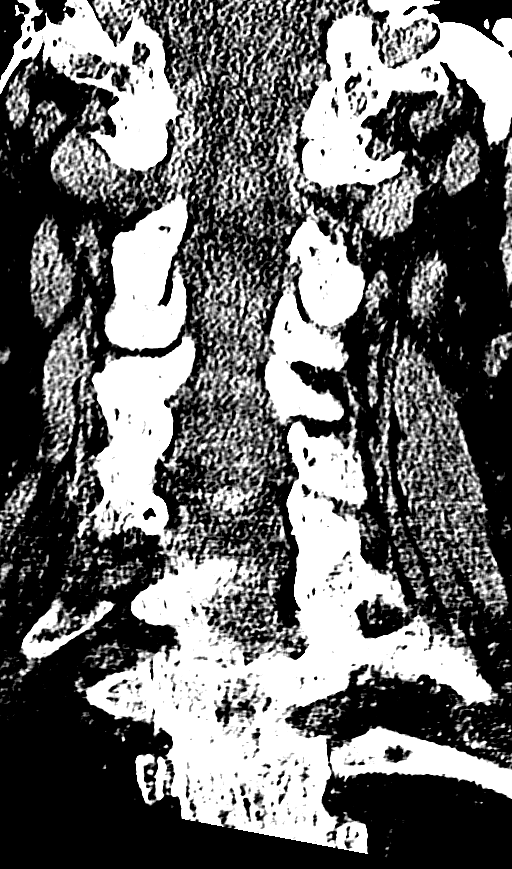
[im 32/53  brain]
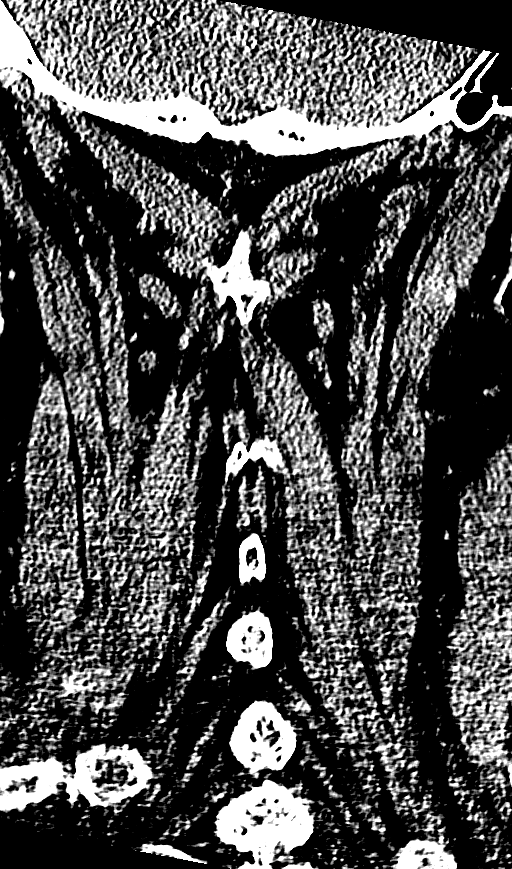

[Series 16: axial bone 2.0 · axial · 0.14mm/px · z∈[+59,+168]mm · 7 of 76 slices shown]
[im 10/76  bone]
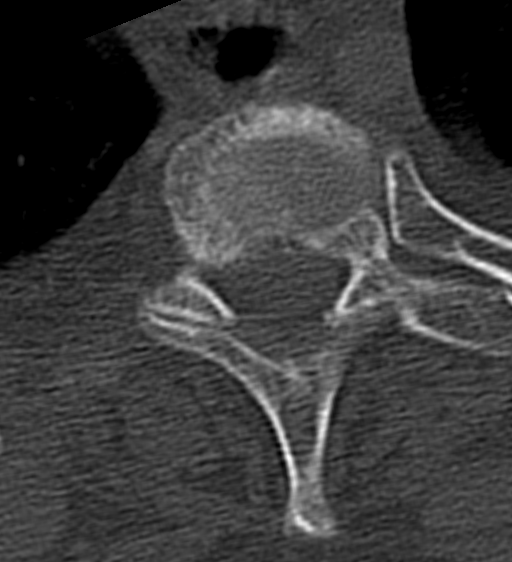
[im 19/76  bone]
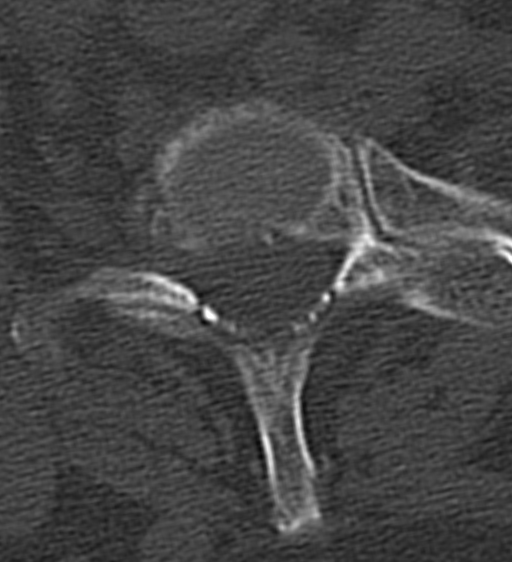
[im 29/76  bone]
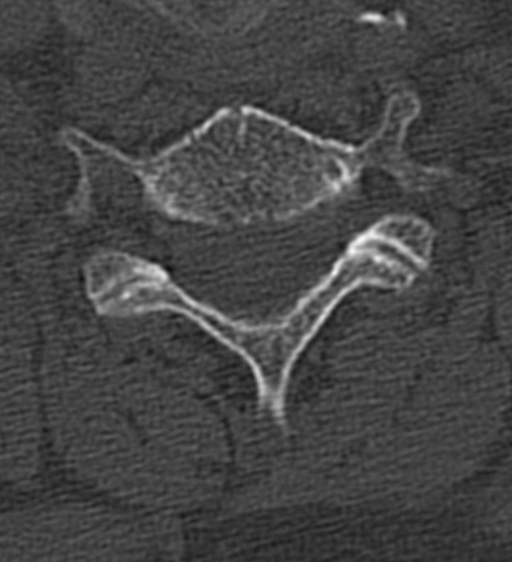
[im 38/76  bone]
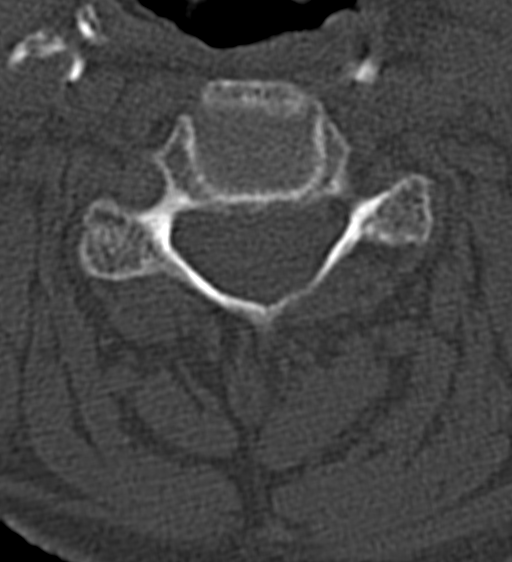
[im 47/76  bone]
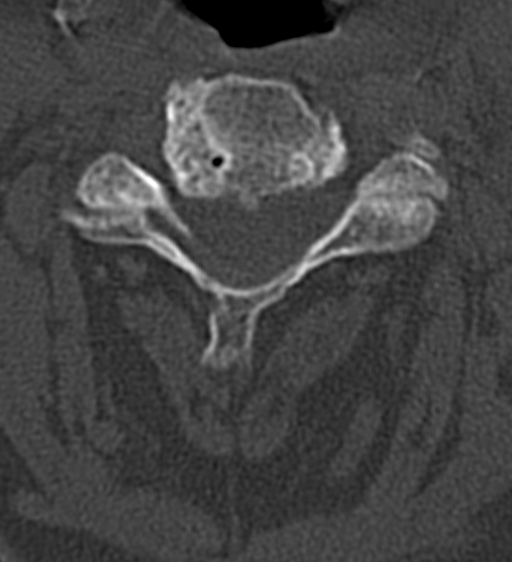
[im 57/76  bone]
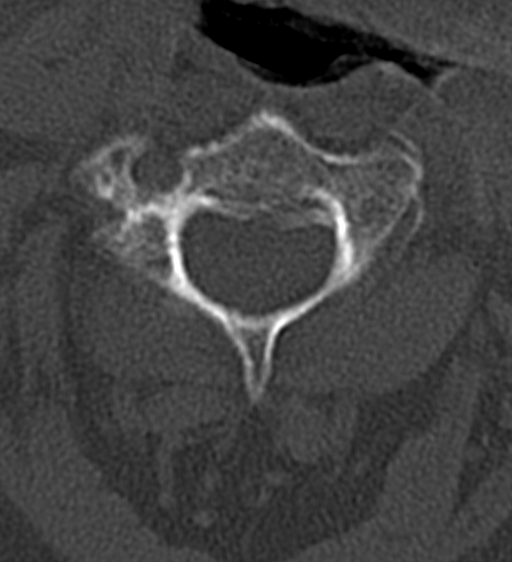
[im 66/76  bone]
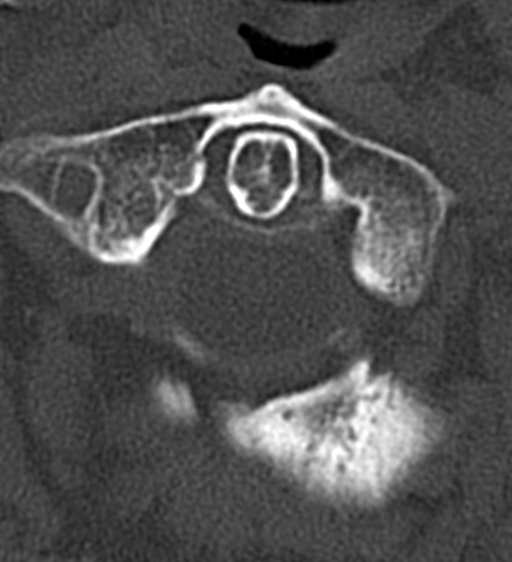

[14 of 47 positions shown; findings below may reference images not displayed]

FINDINGS: CT HEAD FINDINGS

Motion degraded images.

No evidence of parenchymal hemorrhage or extra-axial fluid
collection. No mass lesion, mass effect, or midline shift. No CT
evidence of acute infarction.

Subcortical white matter and periventricular small vessel ischemic
changes. Intracranial atherosclerosis.

Global cortical atrophy, likely age related. Secondary ventricular
prominence.

The visualized paranasal sinuses are essentially clear. The mastoid
air cells are unopacified.

Prior right craniotomy. No evidence of calvarial fracture.

CT CERVICAL SPINE FINDINGS

Motion degraded images.  Repeat [HOSPITAL] C1-2 was performed.

No evidence of fracture or dislocation. Vertebral body heights are
maintained. Dens appears intact (on the repeat imaging).

No prevertebral soft tissue swelling.

Mild to moderate multilevel degenerative changes.

Visualized thyroid gland grossly unremarkable.

Visualized lung apices are clear.
IMPRESSION: Motion degraded images.

No evidence of acute intracranial abnormality. Age related atrophy
with small vessel ischemic changes and intracranial atherosclerosis.

No evidence of traumatic injury to the cervical spine. Mild to
moderate degenerative changes.

## 2015-10-10 IMAGING — CR DG CHEST 1V PORT SAME DAY
1 series · 1 of 1 positions shown · non-contrast
Comparison: 11/07/2013 at 1518 hr

CLINICAL DATA: PICC line adjustment

EXAM:
PORTABLE CHEST - 1 VIEW SAME DAY

[ap portable]
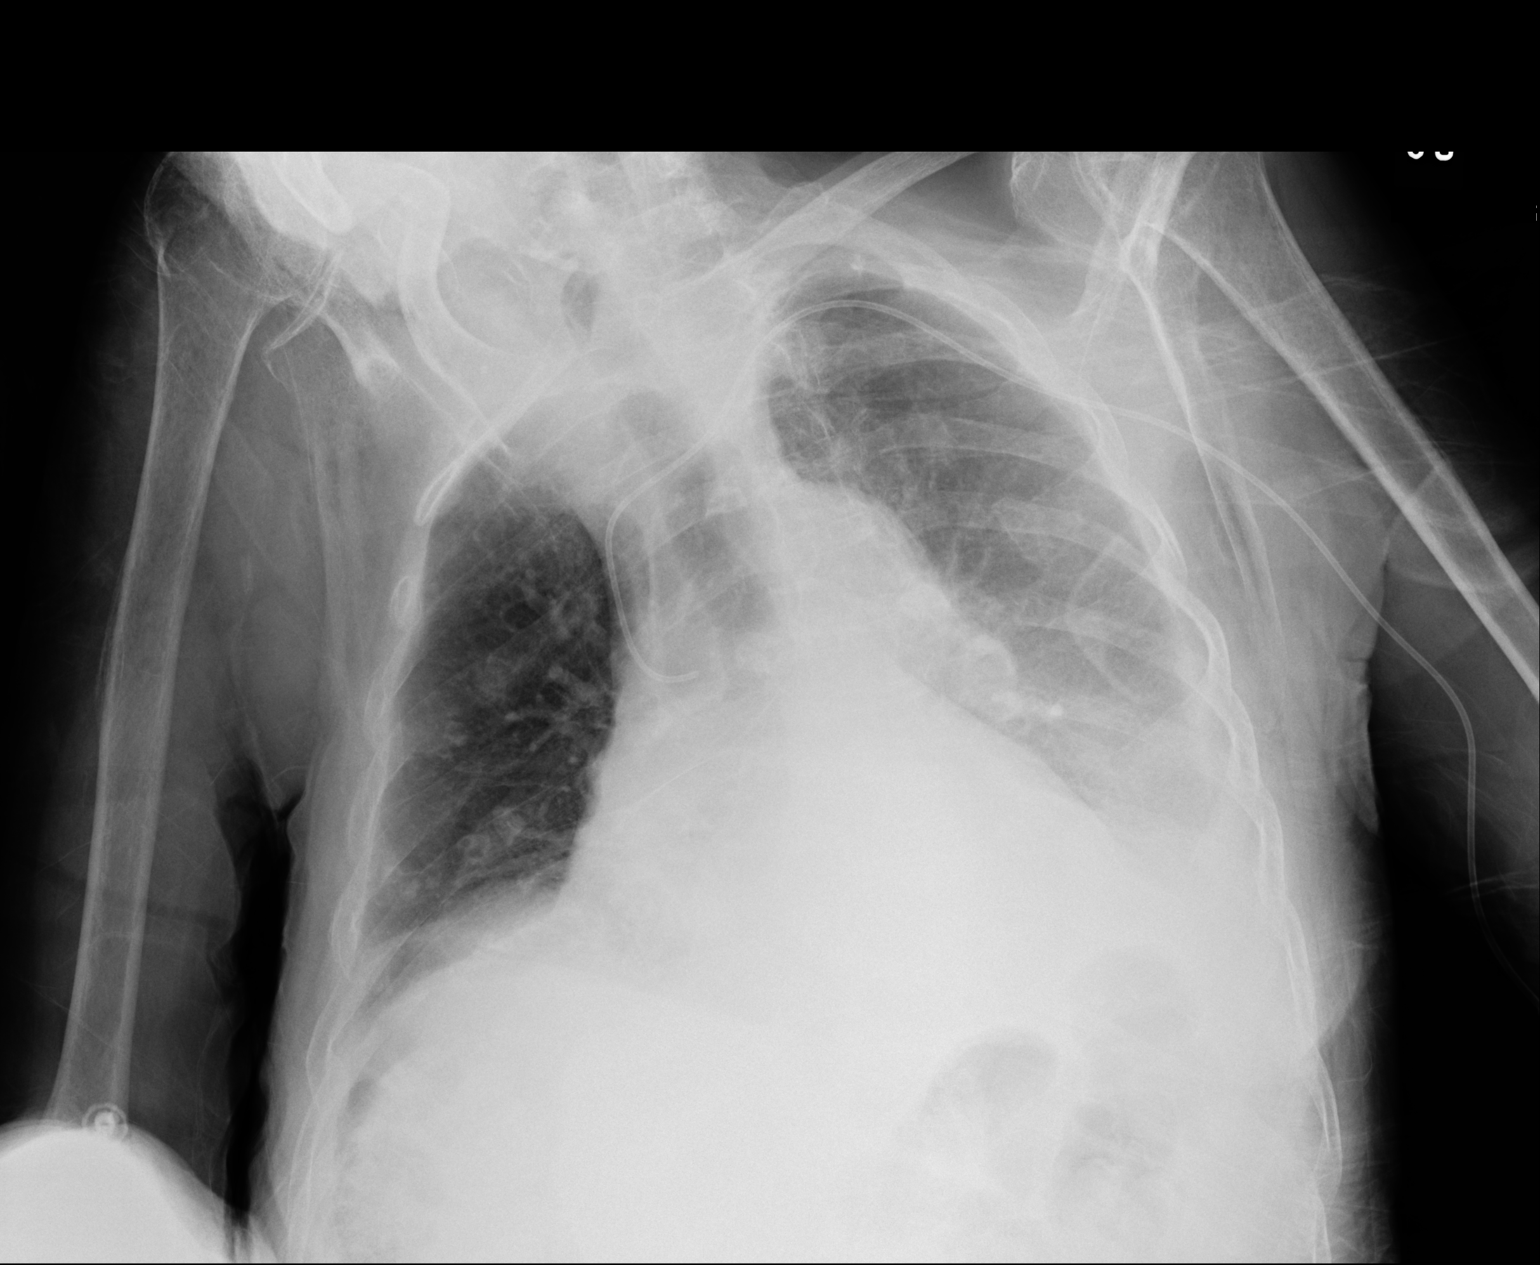

[1 of 1 positions shown; findings below may reference images not displayed]

FINDINGS: The PICC line has been adjusted such that the catheter extends down
the superior vena cava with the tip deflecting into the azygos vein
over several mm.

Left pleural effusion and cardiomegaly again demonstrated. No
pneumothorax
IMPRESSION: PICC line with tip extending down the superior vena cava with tip
deflecting into the azygos vein. This may be repositioned itself
into the SVC with use.
